# Patient Record
Sex: Male | Born: 1945 | Hispanic: Yes | Marital: Married
Health system: Southern US, Community
[De-identification: ages and names within clinical notes are randomized; demographics above are authoritative.]

## PROBLEM LIST (undated history)

## (undated) DIAGNOSIS — E119 Type 2 diabetes mellitus without complications: Secondary | ICD-10-CM

## (undated) DIAGNOSIS — N289 Disorder of kidney and ureter, unspecified: Secondary | ICD-10-CM

---

## 2017-08-30 ENCOUNTER — Other Ambulatory Visit (HOSPITAL_COMMUNITY): Payer: Self-pay | Admitting: Nephrology

## 2017-08-30 ENCOUNTER — Other Ambulatory Visit (HOSPITAL_COMMUNITY): Payer: Self-pay | Admitting: Nurse Practitioner

## 2017-08-30 DIAGNOSIS — Z992 Dependence on renal dialysis: Secondary | ICD-10-CM

## 2017-08-30 DIAGNOSIS — N186 End stage renal disease: Secondary | ICD-10-CM

## 2017-09-06 ENCOUNTER — Other Ambulatory Visit: Payer: Self-pay | Admitting: Radiology

## 2017-09-07 ENCOUNTER — Ambulatory Visit (HOSPITAL_COMMUNITY)
Admission: RE | Admit: 2017-09-07 | Discharge: 2017-09-07 | Disposition: A | Discharge status: Home / Self Care | Payer: Self-pay | Source: Ambulatory Visit | Attending: Nephrology | Admitting: Nephrology

## 2017-09-07 ENCOUNTER — Encounter (HOSPITAL_COMMUNITY): Payer: Self-pay | Admitting: Interventional Radiology

## 2017-09-07 ENCOUNTER — Other Ambulatory Visit (HOSPITAL_COMMUNITY): Payer: Self-pay | Admitting: Nephrology

## 2017-09-07 DIAGNOSIS — N186 End stage renal disease: Secondary | ICD-10-CM | POA: Insufficient documentation

## 2017-09-07 DIAGNOSIS — Z992 Dependence on renal dialysis: Secondary | ICD-10-CM | POA: Insufficient documentation

## 2017-09-07 DIAGNOSIS — Y832 Surgical operation with anastomosis, bypass or graft as the cause of abnormal reaction of the patient, or of later complication, without mention of misadventure at the time of the procedure: Secondary | ICD-10-CM | POA: Insufficient documentation

## 2017-09-07 DIAGNOSIS — T82858A Stenosis of vascular prosthetic devices, implants and grafts, initial encounter: Secondary | ICD-10-CM | POA: Insufficient documentation

## 2017-09-07 HISTORY — PX: IR AV DIALY SHUNT INTRO NEEDLE/INTRACATH INITIAL W/PTA/IMG LEFT: IMG6103

## 2017-09-07 HISTORY — PX: IR US GUIDE VASC ACCESS LEFT: IMG2389

## 2017-09-07 MED ORDER — LIDOCAINE HCL 1 % IJ SOLN
INTRAMUSCULAR | Status: AC
  Filled 2017-09-07: qty 20

## 2017-09-07 MED ORDER — IOPAMIDOL (ISOVUE-300) INJECTION 61%
INTRAVENOUS | Status: AC
  Filled 2017-09-07: qty 100

## 2017-09-07 MED ORDER — LIDOCAINE HCL (PF) 1 % IJ SOLN
INTRAMUSCULAR | Status: DC | PRN
  Administered 2017-09-07: 5 mL

## 2017-09-07 NOTE — Procedures (Signed)
Pre Procedure Dx: ESRD Post Procedure Dx: Same  Technically successful fistulogram with angioplasty.    EBL: Minimal  No immediate complications.  Jay Minola Guin, MD Pager #: [phone]  

## 2017-09-08 ENCOUNTER — Other Ambulatory Visit (HOSPITAL_COMMUNITY): Payer: Self-pay | Admitting: Nephrology

## 2017-09-08 ENCOUNTER — Encounter (HOSPITAL_COMMUNITY): Payer: Self-pay

## 2017-09-08 DIAGNOSIS — N186 End stage renal disease: Secondary | ICD-10-CM

## 2018-01-20 ENCOUNTER — Other Ambulatory Visit (HOSPITAL_COMMUNITY): Payer: Self-pay | Admitting: Nephrology

## 2018-01-20 DIAGNOSIS — N186 End stage renal disease: Secondary | ICD-10-CM

## 2018-01-20 DIAGNOSIS — Z992 Dependence on renal dialysis: Principal | ICD-10-CM

## 2018-01-25 ENCOUNTER — Other Ambulatory Visit: Payer: Self-pay | Admitting: Radiology

## 2018-01-27 ENCOUNTER — Encounter (HOSPITAL_COMMUNITY): Payer: Self-pay

## 2018-01-27 ENCOUNTER — Ambulatory Visit (HOSPITAL_COMMUNITY): Admission: RE | Admit: 2018-01-27 | Payer: Self-pay | Source: Ambulatory Visit

## 2018-04-01 ENCOUNTER — Emergency Department (HOSPITAL_COMMUNITY)
Admission: EM | Admit: 2018-04-01 | Discharge: 2018-04-01 | Disposition: A | Discharge status: Home / Self Care | Payer: Self-pay | Attending: Emergency Medicine | Admitting: Emergency Medicine

## 2018-04-01 ENCOUNTER — Encounter (HOSPITAL_COMMUNITY): Payer: Self-pay | Admitting: Emergency Medicine

## 2018-04-01 ENCOUNTER — Emergency Department (HOSPITAL_COMMUNITY): Payer: Self-pay

## 2018-04-01 ENCOUNTER — Other Ambulatory Visit: Payer: Self-pay

## 2018-04-01 DIAGNOSIS — Y939 Activity, unspecified: Secondary | ICD-10-CM | POA: Insufficient documentation

## 2018-04-01 DIAGNOSIS — Z992 Dependence on renal dialysis: Secondary | ICD-10-CM | POA: Insufficient documentation

## 2018-04-01 DIAGNOSIS — Y999 Unspecified external cause status: Secondary | ICD-10-CM | POA: Insufficient documentation

## 2018-04-01 DIAGNOSIS — N186 End stage renal disease: Secondary | ICD-10-CM | POA: Insufficient documentation

## 2018-04-01 DIAGNOSIS — E11628 Type 2 diabetes mellitus with other skin complications: Secondary | ICD-10-CM | POA: Insufficient documentation

## 2018-04-01 DIAGNOSIS — L03031 Cellulitis of right toe: Secondary | ICD-10-CM | POA: Insufficient documentation

## 2018-04-01 DIAGNOSIS — Y929 Unspecified place or not applicable: Secondary | ICD-10-CM | POA: Insufficient documentation

## 2018-04-01 DIAGNOSIS — X58XXXA Exposure to other specified factors, initial encounter: Secondary | ICD-10-CM | POA: Insufficient documentation

## 2018-04-01 DIAGNOSIS — L089 Local infection of the skin and subcutaneous tissue, unspecified: Secondary | ICD-10-CM | POA: Insufficient documentation

## 2018-04-01 HISTORY — DX: Disorder of kidney and ureter, unspecified: N28.9

## 2018-04-01 HISTORY — DX: Type 2 diabetes mellitus without complications: E11.9

## 2018-04-01 LAB — CBG MONITORING, ED: Glucose-Capillary: 156 mg/dL — ABNORMAL HIGH (ref 70–99)

## 2018-04-01 MED ORDER — CEPHALEXIN 500 MG PO CAPS: 500.0000 mg | ORAL_CAPSULE | Freq: Four times a day (QID) | ORAL | 0 refills | Status: AC

## 2018-04-01 MED ORDER — CEPHALEXIN 250 MG PO CAPS
500.0000 mg | ORAL_CAPSULE | Freq: Once | ORAL | Status: AC
  Administered 2018-04-01: 500 mg via ORAL
  Filled 2018-04-01: qty 2

## 2018-04-01 NOTE — ED Notes (Signed)
Spoke to son in detail regarding pt discharge and concerns for pt going home. No questions after conversation. Son to come and pick up pt soon.

## 2018-04-01 NOTE — Discharge Instructions (Signed)
Your examination today was consistent with diabetic wounds and cellulitis " skin infection" on the toes and foot.  The x-ray did not show evidence of bony infection.  Your sugar was not significant elevated today.  We feel you are safe for outpatient antibiotics and reassessment with your primary doctor in the next several days.  We will dress your wound, please keep it clean.  You will need to have the wound reassessed by your PCP in several days.  If any symptoms change or worsen or you begin having systemic signs of infection such as fevers, chills, nausea, vomiting, please return to the nearest emergency department.

## 2018-04-01 NOTE — ED Notes (Signed)
Patient transported to X-ray 

## 2018-04-01 NOTE — ED Notes (Addendum)
Son phone # [phone] Patient son will come back iif discharged. Nursing will ned to call son for pickup.

## 2018-04-01 NOTE — ED Provider Notes (Signed)
[facility] EMERGENCY DEPARTMENT Provider Note   CSN: [id number] Arrival date & time: 04/01/18  1733    History   Chief Complaint Chief Complaint  Patient presents with   Toe bleeding    HPI Ronald Lopez is a 73 y.o. male.     The history is provided by the patient and medical records. The history is limited by a language barrier. A language interpreter was used.  Rash  Location:  Toe Toe rash location:  R great toe, R second toe and R third toe Quality: blistering and redness   Quality: not painful   Severity:  Moderate Onset quality:  Gradual Duration:  3 days Timing:  Constant Progression:  Worsening Chronicity:  New Relieved by:  Nothing Worsened by:  Nothing Ineffective treatments:  None tried Associated symptoms: diarrhea   Associated symptoms: no abdominal pain, no fatigue, no fever, no headaches, no nausea, no shortness of breath and not vomiting     No past medical history on file.  There are no active problems to display for this patient.     Home Medications    Prior to Admission medications   Not on File    Family History No family history on file.  Social History Social History   Tobacco Use   Smoking status: Not on file  Substance Use Topics   Alcohol use: Not on file   Drug use: Not on file     Allergies   Patient has no allergy information on record.   Review of Systems Review of Systems  Constitutional: Negative for chills, diaphoresis, fatigue and fever.  HENT: Negative for congestion.   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  Respiratory: Negative for cough, chest tightness and shortness of breath.   Cardiovascular: Negative for chest pain, palpitations and leg swelling.  Gastrointestinal: Positive for diarrhea. Negative for abdominal pain, constipation, nausea and vomiting.  Genitourinary: Negative for flank pain.  Musculoskeletal: Negative for back pain, neck  pain and neck stiffness.  Skin: Positive for rash and wound.  Neurological: Negative for headaches.  Psychiatric/Behavioral: Negative for agitation.  All other systems reviewed and are negative.    Physical Exam Updated Vital Signs BP (!) 161/84    Pulse 73    Temp 98.2 F (36.8 C) (Oral)    Resp 17    SpO2 100%   Physical Exam Vitals signs and nursing note reviewed.  Constitutional:      General: He is not in acute distress.    Appearance: He is not ill-appearing or toxic-appearing.  HENT:     Head: Normocephalic.     Nose: No congestion or rhinorrhea.  Eyes:     Extraocular Movements: Extraocular movements intact.     Conjunctiva/sclera: Conjunctivae normal.     Pupils: Pupils are equal, round, and reactive to light.  Neck:     Musculoskeletal: No muscular tenderness.  Cardiovascular:     Rate and Rhythm: Normal rate.     Pulses: Normal pulses.     Heart sounds: No murmur.  Pulmonary:     Effort: Pulmonary effort is normal.     Breath sounds: No stridor. No wheezing, rhonchi or rales.  Chest:     Chest wall: No tenderness.  Abdominal:     General: Abdomen is flat. There is no distension.  Musculoskeletal:        General: No tenderness.     Right lower leg: No edema.     Left lower leg:  No edema.       Feet:  Skin:    Capillary Refill: Capillary refill takes less than 2 seconds.     Findings: Erythema present.  Neurological:     Mental Status: He is alert.     Sensory: Sensory deficit (bilateral foot numbness) present.     Motor: No weakness.     Comments: Numbness in bilater feet similar for last 10 years per pt report.   Psychiatric:        Mood and Affect: Mood normal.           ED Treatments / Results  Labs (all labs ordered are listed, but only abnormal results are displayed) Labs Reviewed  CBG MONITORING, ED - Abnormal; Notable for the following components:      Result Value   Glucose-Capillary 156 (*)    All other components within normal  limits    EKG None  Radiology Dg Foot Complete Right  Result Date: 04/01/2018 CLINICAL DATA:  Bleeding toes. EXAM: RIGHT FOOT COMPLETE - 3+ VIEW COMPARISON:  None. FINDINGS: Severe bony demineralization. Extensive vascular calcifications. Deformity of the proximal phalanx of the second toe probably from an old fracture which has healed, correlate with any findings of the second toe on physical exam. No appreciable bony destructive findings to suggest active osteomyelitis. IMPRESSION: 1. No bony destructive findings characteristic of osteomyelitis. No gas tracking in the soft tissues. 2. Deformity of the proximal phalanx second toe, probably due to an old fracture, correlate with any physical exam findings of the second toe. 3. Markedly severe bony demineralization. 4. Markedly severe atherosclerotic vascular calcifications in the foot. Electronically Signed   By: Van Clines M.D.   On: 04/01/2018 19:15    Procedures Procedures (including critical care time)  Medications Ordered in ED Medications  cephALEXin (KEFLEX) capsule 500 mg (500 mg Oral Given 04/01/18 2002)     Initial Impression / Assessment and Plan / ED Course  I have reviewed the triage vital signs and the nursing notes.  Pertinent labs & imaging results that were available during my care of the patient were reviewed by me and considered in my medical decision making (see chart for details).        Ronald Lopez is a 73 y.o. male with a past medical history significant for diabetes with diabetic neuropathies for 10 years, chronic pains, and ESRD who presents for right foot bleeding.  Patient is brought in by EMS he reports that patient's wife changes his socks when he needs to go out.  She change his socks today and saw some bleeding on the toes.  At the patient dialysis center, patient had more bleeding from his right great, second, and third toes prompting him to come in for evaluation.  Patient reports that he  cannot feel his toes and feet and has not felt his feet for 10 years due to neuropathy.  He reports that he does not like wearing masks.  He denies fevers or chills.  He reports chronic headaches, neck pains, diffuse body aches.  He denies nausea, vomiting, or constipation.  He denies any pain in his foot but he cannot feel the foot.  On exam, patient has blisters and what appear to be pressure wounds on his great, second, and third toe on the right.  There is some darkened area.  There is some mild erythema just proximal to the wounds. Patient has decreased sensation in all foot.  Patient does have a palpable DP pulse.  Patient can move his foot and right leg similar to left.  Patient had otherwise clear lungs, nontender chest, nontender abdomen.  Spanish interpreter was used for all conversations.  Clinical photos were taken, see above for images of the feet.  Clinically I suspect patient has pressure wounds on his right foot due to infrequent sock changes and his neuropathies.  He was primarily sent here due to bleeding which has resolved.  He is not currently bleeding whatsoever.  I am however also concerned about mild cellulitis due to the erythema more proximal.  Patient has no sensation and this cannot tell about tenderness however patient with x-ray to rule out osteomyelitis or subcutaneous gas.  Low suspicion for this.  Patient CBG was in the 150s, do not feel he is in DKA or other significant glucose abnormality.  Given his lack of systemic symptoms of fevers chills nausea or vomiting, low suspicion for sepsis at this time.  Vital signs reassuring on arrival.  Patient will have x-ray to look for osteomyelitis and if imaging is reassuring and patient has continued resolution of his bleeding, we will dressed the wounds with dressings and start patient on antibiotics and will likely be stable for discharge.  7:38 PM X-ray showed no bony destructive findings concerning for osteomyelitis.  No gas in  the subcutaneous tissues.  Old fracture was seen and no other significant abnormalities.  Do not feel patient has osteomyelitis and suspect he has a mild cellulitis in the setting of these diabetic pressure wounds on his toes.  Wounds will be cleaned and dressed.  Patient will be given dose of Keflex for cellulitis.  Patient will follow-up with his PCP in the neck several days and be started on Keflex for cellulitis.  Patient understood and agreed with this plan of care as well as return precautions.  He had no other questions or concerns and will be discharged.    A Spanish interpreter was used for all interactions with the patient.  Final Clinical Impressions(s) / ED Diagnoses   Final diagnoses:  Cellulitis of toe of right foot  Diabetic foot infection [facility])    ED Discharge Orders         Ordered    cephALEXin (KEFLEX) 500 MG capsule  4 times daily     04/01/18 1958         Clinical Impression: 1. Cellulitis of toe of right foot   2. Diabetic foot infection ([place])     Disposition: Discharge  Condition: Good  I have discussed the results, Dx and Tx plan with the pt(& family if present). He/she/they expressed understanding and agree(s) with the plan. Discharge instructions discussed at great length. Strict return precautions discussed and pt &/or family have verbalized understanding of the instructions. No further questions at time of discharge.    New Prescriptions   CEPHALEXIN (KEFLEX) 500 MG CAPSULE    Take 1 capsule (500 mg total) by mouth 4 (four) times daily for 14 days.    Follow Up: Jamul Eldridge [id number] [phone] Schedule an appointment as soon as possible for a visit    [place] [address] [id number] mc [place]       Wally Behan, Gwenyth Allegra, MD 04/01/18 2056

## 2018-04-01 NOTE — ED Triage Notes (Signed)
Pt picked up from home due to r toe bleeding noticed by wife this morning. Pt has hx of dialysis and DM. Wife unsure of how long toe has been bleeding.

## 2018-04-01 NOTE — ED Notes (Signed)
Patient verbalizes understanding of discharge instructions. Opportunity for questioning and answers were provided. Armband removed by staff, pt discharged from ED.  

## 2018-04-01 NOTE — ED Notes (Signed)
Pt transported to xray 

## 2018-04-25 ENCOUNTER — Other Ambulatory Visit: Payer: Self-pay

## 2018-04-25 ENCOUNTER — Emergency Department (HOSPITAL_COMMUNITY): Payer: Self-pay

## 2018-04-25 ENCOUNTER — Inpatient Hospital Stay (HOSPITAL_COMMUNITY)
Admission: EM | Admit: 2018-04-25 | Discharge: 2018-04-27 | DRG: 299 | Disposition: A | Discharge status: Hospice | Payer: Self-pay | Attending: Internal Medicine | Admitting: Internal Medicine

## 2018-04-25 ENCOUNTER — Encounter (HOSPITAL_COMMUNITY): Payer: Self-pay | Admitting: Emergency Medicine

## 2018-04-25 DIAGNOSIS — I70203 Unspecified atherosclerosis of native arteries of extremities, bilateral legs: Secondary | ICD-10-CM | POA: Diagnosis present

## 2018-04-25 DIAGNOSIS — E1140 Type 2 diabetes mellitus with diabetic neuropathy, unspecified: Secondary | ICD-10-CM

## 2018-04-25 DIAGNOSIS — Z79899 Other long term (current) drug therapy: Secondary | ICD-10-CM

## 2018-04-25 DIAGNOSIS — E119 Type 2 diabetes mellitus without complications: Secondary | ICD-10-CM

## 2018-04-25 DIAGNOSIS — L03031 Cellulitis of right toe: Secondary | ICD-10-CM | POA: Diagnosis present

## 2018-04-25 DIAGNOSIS — Z888 Allergy status to other drugs, medicaments and biological substances status: Secondary | ICD-10-CM

## 2018-04-25 DIAGNOSIS — E1122 Type 2 diabetes mellitus with diabetic chronic kidney disease: Secondary | ICD-10-CM | POA: Diagnosis present

## 2018-04-25 DIAGNOSIS — Z515 Encounter for palliative care: Secondary | ICD-10-CM | POA: Diagnosis present

## 2018-04-25 DIAGNOSIS — E1152 Type 2 diabetes mellitus with diabetic peripheral angiopathy with gangrene: Principal | ICD-10-CM | POA: Diagnosis present

## 2018-04-25 DIAGNOSIS — G9349 Other encephalopathy: Secondary | ICD-10-CM | POA: Diagnosis present

## 2018-04-25 DIAGNOSIS — R778 Other specified abnormalities of plasma proteins: Secondary | ICD-10-CM | POA: Diagnosis present

## 2018-04-25 DIAGNOSIS — I998 Other disorder of circulatory system: Secondary | ICD-10-CM

## 2018-04-25 DIAGNOSIS — I700 Atherosclerosis of aorta: Secondary | ICD-10-CM | POA: Diagnosis present

## 2018-04-25 DIAGNOSIS — R69 Illness, unspecified: Secondary | ICD-10-CM

## 2018-04-25 DIAGNOSIS — N186 End stage renal disease: Secondary | ICD-10-CM | POA: Diagnosis present

## 2018-04-25 DIAGNOSIS — G9341 Metabolic encephalopathy: Secondary | ICD-10-CM | POA: Diagnosis present

## 2018-04-25 DIAGNOSIS — N250 Renal osteodystrophy: Secondary | ICD-10-CM | POA: Diagnosis present

## 2018-04-25 DIAGNOSIS — E875 Hyperkalemia: Secondary | ICD-10-CM | POA: Diagnosis present

## 2018-04-25 DIAGNOSIS — M549 Dorsalgia, unspecified: Secondary | ICD-10-CM | POA: Diagnosis present

## 2018-04-25 DIAGNOSIS — J90 Pleural effusion, not elsewhere classified: Secondary | ICD-10-CM | POA: Diagnosis present

## 2018-04-25 DIAGNOSIS — Z9115 Patient's noncompliance with renal dialysis: Secondary | ICD-10-CM

## 2018-04-25 DIAGNOSIS — R109 Unspecified abdominal pain: Secondary | ICD-10-CM | POA: Diagnosis present

## 2018-04-25 DIAGNOSIS — R4182 Altered mental status, unspecified: Secondary | ICD-10-CM | POA: Diagnosis present

## 2018-04-25 DIAGNOSIS — D638 Anemia in other chronic diseases classified elsewhere: Secondary | ICD-10-CM | POA: Diagnosis present

## 2018-04-25 DIAGNOSIS — R64 Cachexia: Secondary | ICD-10-CM | POA: Diagnosis present

## 2018-04-25 DIAGNOSIS — E11649 Type 2 diabetes mellitus with hypoglycemia without coma: Secondary | ICD-10-CM | POA: Diagnosis present

## 2018-04-25 DIAGNOSIS — Z9889 Other specified postprocedural states: Secondary | ICD-10-CM

## 2018-04-25 DIAGNOSIS — I96 Gangrene, not elsewhere classified: Secondary | ICD-10-CM

## 2018-04-25 DIAGNOSIS — R7989 Other specified abnormal findings of blood chemistry: Secondary | ICD-10-CM | POA: Diagnosis present

## 2018-04-25 DIAGNOSIS — E43 Unspecified severe protein-calorie malnutrition: Secondary | ICD-10-CM | POA: Diagnosis present

## 2018-04-25 DIAGNOSIS — I7090 Unspecified atherosclerosis: Secondary | ICD-10-CM | POA: Diagnosis present

## 2018-04-25 DIAGNOSIS — Z992 Dependence on renal dialysis: Secondary | ICD-10-CM

## 2018-04-25 DIAGNOSIS — R1013 Epigastric pain: Secondary | ICD-10-CM

## 2018-04-25 DIAGNOSIS — I708 Atherosclerosis of other arteries: Secondary | ICD-10-CM | POA: Diagnosis present

## 2018-04-25 DIAGNOSIS — Z66 Do not resuscitate: Secondary | ICD-10-CM | POA: Diagnosis present

## 2018-04-25 LAB — CBC
HCT: 35.3 % — ABNORMAL LOW (ref 39.0–52.0)
Hemoglobin: 10.9 g/dL — ABNORMAL LOW (ref 13.0–17.0)
MCH: 27.7 pg (ref 26.0–34.0)
MCHC: 30.9 g/dL (ref 30.0–36.0)
MCV: 89.6 fL (ref 80.0–100.0)
Platelets: 117 10*3/uL — ABNORMAL LOW (ref 150–400)
RBC: 3.94 MIL/uL — ABNORMAL LOW (ref 4.22–5.81)
RDW: 19.9 % — ABNORMAL HIGH (ref 11.5–15.5)
WBC: 5.7 10*3/uL (ref 4.0–10.5)
nRBC: 0 % (ref 0.0–0.2)

## 2018-04-25 LAB — PROTIME-INR
INR: 1.4 — ABNORMAL HIGH (ref 0.8–1.2)
Prothrombin Time: 16.6 seconds — ABNORMAL HIGH (ref 11.4–15.2)

## 2018-04-25 LAB — POCT I-STAT EG7
Bicarbonate: 24.8 mmol/L (ref 20.0–28.0)
Calcium, Ion: 0.99 mmol/L — ABNORMAL LOW (ref 1.15–1.40)
HCT: 35 % — ABNORMAL LOW (ref 39.0–52.0)
Hemoglobin: 11.9 g/dL — ABNORMAL LOW (ref 13.0–17.0)
O2 Saturation: 43 %
Potassium: 5.4 mmol/L — ABNORMAL HIGH (ref 3.5–5.1)
Sodium: 139 mmol/L (ref 135–145)
TCO2: 26 mmol/L (ref 22–32)
pCO2, Ven: 42.2 mmHg — ABNORMAL LOW (ref 44.0–60.0)
pH, Ven: 7.378 (ref 7.250–7.430)
pO2, Ven: 25 mmHg — CL (ref 32.0–45.0)

## 2018-04-25 LAB — CBG MONITORING, ED: Glucose-Capillary: 61 mg/dL — ABNORMAL LOW (ref 70–99)

## 2018-04-25 LAB — COMPREHENSIVE METABOLIC PANEL
ALT: 8 U/L (ref 0–44)
AST: 16 U/L (ref 15–41)
Albumin: 3.2 g/dL — ABNORMAL LOW (ref 3.5–5.0)
Alkaline Phosphatase: 77 U/L (ref 38–126)
Anion gap: 24 — ABNORMAL HIGH (ref 5–15)
BUN: 92 mg/dL — ABNORMAL HIGH (ref 8–23)
CO2: 20 mmol/L — ABNORMAL LOW (ref 22–32)
Calcium: 8.9 mg/dL (ref 8.9–10.3)
Chloride: 96 mmol/L — ABNORMAL LOW (ref 98–111)
Creatinine, Ser: 14.28 mg/dL — ABNORMAL HIGH (ref 0.61–1.24)
GFR calc Af Amer: 3 mL/min — ABNORMAL LOW (ref >60)
GFR calc non Af Amer: 3 mL/min — ABNORMAL LOW (ref >60)
Glucose, Bld: 71 mg/dL (ref 70–99)
Potassium: 5.3 mmol/L — ABNORMAL HIGH (ref 3.5–5.1)
Sodium: 140 mmol/L (ref 135–145)
Total Bilirubin: 0.9 mg/dL (ref 0.3–1.2)
Total Protein: 8.9 g/dL — ABNORMAL HIGH (ref 6.5–8.1)

## 2018-04-25 LAB — TROPONIN I
Troponin I: 0.1 ng/mL (ref <0.03)
Troponin I: 0.07 ng/mL (ref <0.03)
Troponin I: 0.07 ng/mL
Troponin I: 0.06 ng/mL (ref <0.03)

## 2018-04-25 LAB — TYPE AND SCREEN
ABO/RH(D): O POS
Antibody Screen: NEGATIVE

## 2018-04-25 LAB — ABO/RH: ABO/RH(D): O POS

## 2018-04-25 LAB — GLUCOSE, CAPILLARY: Glucose-Capillary: 86 mg/dL (ref 70–99)

## 2018-04-25 LAB — LACTIC ACID, PLASMA: Lactic Acid, Venous: 1.6 mmol/L (ref 0.5–1.9)

## 2018-04-25 LAB — LACTATE DEHYDROGENASE: LDH: 130 U/L (ref 98–192)

## 2018-04-25 LAB — LIPASE, BLOOD: Lipase: 25 U/L (ref 11–51)

## 2018-04-25 MED ORDER — TRAMADOL HCL 50 MG PO TABS: 25.0000 mg | ORAL_TABLET | Freq: Four times a day (QID) | ORAL | Status: DC | PRN

## 2018-04-25 MED ORDER — SODIUM CHLORIDE 0.9 % IV SOLN: 100.0000 mL | INTRAVENOUS | Status: DC | PRN

## 2018-04-25 MED ORDER — ACETAMINOPHEN 325 MG PO TABS: 650.0000 mg | ORAL_TABLET | Freq: Four times a day (QID) | ORAL | Status: DC | PRN

## 2018-04-25 MED ORDER — IOHEXOL 350 MG/ML SOLN
100.0000 mL | Freq: Once | INTRAVENOUS | Status: AC | PRN
  Administered 2018-04-25: 100 mL via INTRAVENOUS

## 2018-04-25 MED ORDER — ACETAMINOPHEN 650 MG RE SUPP: 650.0000 mg | Freq: Four times a day (QID) | RECTAL | Status: DC | PRN

## 2018-04-25 MED ORDER — SEVELAMER CARBONATE 800 MG PO TABS: 1600.0000 mg | ORAL_TABLET | Freq: Three times a day (TID) | ORAL | Status: DC

## 2018-04-25 MED ORDER — LORAZEPAM 2 MG/ML IJ SOLN
0.5000 mg | Freq: Once | INTRAMUSCULAR | Status: AC
  Administered 2018-04-26: 0.5 mg via INTRAVENOUS
  Filled 2018-04-25: qty 1

## 2018-04-25 MED ORDER — FENTANYL CITRATE (PF) 100 MCG/2ML IJ SOLN
25.0000 ug | Freq: Once | INTRAMUSCULAR | Status: AC
  Administered 2018-04-25: 25 ug via INTRAVENOUS
  Filled 2018-04-25: qty 2

## 2018-04-25 MED ORDER — VANCOMYCIN HCL IN DEXTROSE 500-5 MG/100ML-% IV SOLN
500.0000 mg | INTRAVENOUS | Status: DC
  Filled 2018-04-25: qty 100

## 2018-04-25 MED ORDER — MORPHINE SULFATE (PF) 2 MG/ML IV SOLN: 1.0000 mg | INTRAVENOUS | Status: DC | PRN

## 2018-04-25 MED ORDER — DIPHENHYDRAMINE HCL 50 MG/ML IJ SOLN
25.0000 mg | Freq: Once | INTRAMUSCULAR | Status: AC
  Administered 2018-04-25: 25 mg via INTRAVENOUS
  Filled 2018-04-25: qty 1

## 2018-04-25 MED ORDER — MORPHINE SULFATE (PF) 2 MG/ML IV SOLN
2.0000 mg | Freq: Once | INTRAVENOUS | Status: AC
  Administered 2018-04-25: 2 mg via INTRAVENOUS
  Filled 2018-04-25: qty 1

## 2018-04-25 MED ORDER — RISAQUAD PO CAPS
1.0000 | ORAL_CAPSULE | Freq: Every day | ORAL | Status: DC
  Administered 2018-04-27: 10:00:00 1 via ORAL
  Filled 2018-04-25 (×2): qty 1

## 2018-04-25 MED ORDER — ONDANSETRON HCL 4 MG/2ML IJ SOLN: 4.0000 mg | Freq: Four times a day (QID) | INTRAMUSCULAR | Status: DC | PRN

## 2018-04-25 MED ORDER — CHLORHEXIDINE GLUCONATE CLOTH 2 % EX PADS: 6.0000 | MEDICATED_PAD | Freq: Every day | CUTANEOUS | Status: DC

## 2018-04-25 MED ORDER — HALOPERIDOL LACTATE 5 MG/ML IJ SOLN
2.0000 mg | Freq: Once | INTRAMUSCULAR | Status: DC
  Filled 2018-04-25: qty 1

## 2018-04-25 MED ORDER — PIPERACILLIN-TAZOBACTAM 3.375 G IVPB 30 MIN
3.3750 g | Freq: Once | INTRAVENOUS | Status: AC
  Administered 2018-04-25: 23:00:00 3.375 g via INTRAVENOUS

## 2018-04-25 MED ORDER — PIPERACILLIN-TAZOBACTAM 3.375 G IVPB
3.3750 g | Freq: Two times a day (BID) | INTRAVENOUS | Status: DC
  Administered 2018-04-26 – 2018-04-27 (×2): 3.375 g via INTRAVENOUS
  Filled 2018-04-25 (×3): qty 50

## 2018-04-25 MED ORDER — SODIUM CHLORIDE 0.9 % IV SOLN
1.0000 g | Freq: Once | INTRAVENOUS | Status: AC
  Administered 2018-04-25: 18:00:00 1 g via INTRAVENOUS
  Filled 2018-04-25: qty 10

## 2018-04-25 MED ORDER — INSULIN ASPART 100 UNIT/ML ~~LOC~~ SOLN: 0.0000 [IU] | Freq: Three times a day (TID) | SUBCUTANEOUS | Status: DC

## 2018-04-25 MED ORDER — ONDANSETRON HCL 4 MG PO TABS: 4.0000 mg | ORAL_TABLET | Freq: Four times a day (QID) | ORAL | Status: DC | PRN

## 2018-04-25 MED ORDER — VANCOMYCIN HCL IN DEXTROSE 1-5 GM/200ML-% IV SOLN
1000.0000 mg | Freq: Once | INTRAVENOUS | Status: AC
  Administered 2018-04-25: 1000 mg via INTRAVENOUS
  Filled 2018-04-25: qty 200

## 2018-04-25 MED ORDER — DEXTROSE 50 % IV SOLN
1.0000 | Freq: Once | INTRAVENOUS | Status: AC
  Administered 2018-04-25: 17:00:00 50 mL via INTRAVENOUS
  Filled 2018-04-25: qty 50

## 2018-04-25 MED ORDER — HEPARIN SODIUM (PORCINE) 5000 UNIT/ML IJ SOLN
5000.0000 [IU] | Freq: Three times a day (TID) | INTRAMUSCULAR | Status: DC
  Administered 2018-04-25 – 2018-04-27 (×4): 5000 [IU] via SUBCUTANEOUS
  Filled 2018-04-25 (×5): qty 1

## 2018-04-25 MED ORDER — SODIUM ZIRCONIUM CYCLOSILICATE 5 G PO PACK
5.0000 g | PACK | Freq: Once | ORAL | Status: DC
  Filled 2018-04-25 (×2): qty 1

## 2018-04-25 MED ORDER — FENTANYL CITRATE (PF) 100 MCG/2ML IJ SOLN
50.0000 ug | Freq: Once | INTRAMUSCULAR | Status: AC
  Administered 2018-04-25: 50 ug via INTRAVENOUS
  Filled 2018-04-25: qty 2

## 2018-04-25 MED ORDER — ATORVASTATIN CALCIUM 10 MG PO TABS
20.0000 mg | ORAL_TABLET | Freq: Every day | ORAL | Status: DC
  Administered 2018-04-27: 10:00:00 20 mg via ORAL
  Filled 2018-04-25 (×2): qty 2

## 2018-04-25 NOTE — Progress Notes (Signed)
Pharmacy Antibiotic Note  Suhail Peloquin is a 73 y.o. male admitted on 04/25/2018 with cellulitis.  Pharmacy has been consulted for vancomycin and zosyn dosing. Pt is afebrile and WBC is WNL. Pt with history of ESRD on HD.   Plan: Vancomycin 1gm IV x 1 then 500mg  post-HD Zosyn 3.375gm IV Q12H (4 hr inf) F/u renal fxn, C&S, clinical status and pre-HD vanc level PRN   Temp (24hrs), Avg:95.8 F (35.4 C), Min:95.8 F (35.4 C), Max:95.8 F (35.4 C)  Recent Labs  Lab 04/25/18 1532  WBC 5.7  CREATININE 14.28*  LATICACIDVEN 1.6    CrCl cannot be calculated (Unknown ideal weight.).    No Known Allergies  Antimicrobials this admission: Vanc 4/21>> Zosyn 4/21>> CTX x 1 4/21  Dose adjustments this admission: N/A  Microbiology results: Pending  Thank you for allowing pharmacy to be a part of this patient's care.  Zafirah Vanzee, Rande Lawman 04/25/2018 7:00 PM

## 2018-04-25 NOTE — H&P (Signed)
History and Physical    Ronald Lopez GYK:[id number] DOB: 05/20/1945 DOA: 04/25/2018  Referring MD/NP/PA:   PCP: Patient, No Pcp Per   Patient coming from:  The patient is coming from home.  Recently declining significantly  Chief Complaint: abdominal pain and confusion  HPI: Ronald Lopez is a 73 y.o. male with medical history significant of HTN, HLD, DM, ESRD on HD, presented to ED for abdominal pain. History is mostly obtained from ED physician and patient's son due to patient's altered mental status and language barrier. Per son, patient has been physically declining in the past 2 to 3 weeks, refusing eating and dialysis. Not take medications. He has been c/o generalized pain, hurting everywhere. No fever, chills, nausea, vomit or diarrhea. Today he finally went to dialysis but eventually sent to ED due to sudden severe abdominal pain radiating to back.   ED Course: pt was found to be confused. Denies chest pain, c/o pain but not able to specifically localize pain. Right foot toe necrotic changes noted. Afebrile, no tachycardia, BP 124/92-157/97, O2 sat 92-100% on room air. Labs significant for K 5.3 BUN/Cr 92/14.28, bicarb 20, anion gap 24, VBG pH 7.378, hemoglobin 10.9, troponin 0.1, EKG with diffuse T wave inversion with repeated EKG. CXR showed large, loculated left pleural effusion with underlying atelectasis/consolidation, Chest CTA and Ct Angio Aortobifemoral no PE or dissection, showed extensive atherosclerosis without significant obstruction, and large left pleural effusion. Pt is given vanc and ceftriaxone and admit for further management.   Review of Systems:   General: no fevers, chills, significant weight loss, has poor appetite, weakness HEENT: no blurry vision, hearing changes or sore throat Respiratory: no dyspnea, coughing, wheezing CV: no chest pain, no palpitations GI: abdominal pain, no nausea, vomiting, diarrhea, constipation Ext: no leg edema Neuro: no  unilateral weakness, numbness, or tingling, no vision change or hearing loss Skin: right foot toe necrotic wound. Heme: No easy bruising.  Travel history: No recent long distant travel.  Allergy: No Known Allergies  Past Medical History:  Diagnosis Date   Diabetes mellitus without complication ([place])    Renal disorder     Past Surgical History:  Procedure Laterality Date   IR AV DIALY SHUNT INTRO NEEDLE/INTRACATH INITIAL W/PTA/IMG LEFT  09/07/2017   IR US GUIDE VASC ACCESS LEFT  09/07/2017    Social History:  has no history on file for tobacco, alcohol, and drug.  Family History: History reviewed. No pertinent family history.   Prior to Admission medications   Medication Sig Start Date End Date Taking? Authorizing Provider  ferric citrate (AURYXIA) 1 GM 210 MG(Fe) tablet Take 420 mg by mouth 3 (three) times daily with meals.   Yes [provider]  atorvastatin (LIPITOR) 20 MG tablet Take 20 mg by mouth daily.    [provider]  carvedilol (COREG) 3.125 MG tablet Take 3.125 mg by mouth 2 (two) times daily with a meal.    [provider]  losartan (COZAAR) 50 MG tablet Take 50 mg by mouth daily.    [provider]  sevelamer carbonate (RENVELA) 800 MG tablet Take 1,600 mg by mouth 3 (three) times daily with meals.    [provider]    Physical Exam: Vitals:   04/25/18 1645 04/25/18 1715 04/25/18 1730 04/25/18 1745  BP: (!) 143/74 (!) 150/78 (!) 151/52 (!) 124/92  Pulse: 66 64 68 66  Resp: 15 11 (!) 7 10  Temp:      TempSrc:  SpO2: 92% 100% 100% 97%   General: Not in acute distress HEENT:       Eyes: right eye blind.       ENT: No discharge from the ears and nose       Neck: No JVD Cardiac: S1/S2, RRR, No murmurs, No gallops or rubs. Respiratory: diminished breath sound at left side. No rales, wheezing, rhonchi or rubs. GI: Soft, scaphoid, tenderness at epigastric region, no rebound pain, no organomegaly, BS  present. Ext: No pitting leg edema bilaterally.  Pedal pulses palpable, front part of right foot is cold. Skin: 2nd and 3rd toe on right foot necrotic, great toe and 4th toe partial necrotic and broken blister.  Scratch marks noted on the back. Neuro: Alert, not able to assess orientation, cranial nerves II-XII grossly intact, moves all extremities. Psych: not able to assess  Labs on Admission: I have personally reviewed following labs and imaging studies  CBC: Recent Labs  Lab 04/25/18 1532 04/25/18 1607  WBC 5.7  --   HGB 10.9* 11.9*  HCT 35.3* 35.0*  MCV 89.6  --   PLT 117*  --    Basic Metabolic Panel: Recent Labs  Lab 04/25/18 1532 04/25/18 1607  NA 140 139  K 5.3* 5.4*  CL 96*  --   CO2 20*  --   GLUCOSE 71  --   BUN 92*  --   CREATININE 14.28*  --   CALCIUM 8.9  --    GFR: CrCl cannot be calculated (Unknown ideal weight.). Liver Function Tests: Recent Labs  Lab 04/25/18 1532  AST 16  ALT 8  ALKPHOS 77  BILITOT 0.9  PROT 8.9*  ALBUMIN 3.2*   Recent Labs  Lab 04/25/18 1532  LIPASE 25   No results for input(s): AMMONIA in the last 168 hours. Coagulation Profile: Recent Labs  Lab 04/25/18 1532  INR 1.4*   Cardiac Enzymes: Recent Labs  Lab 04/25/18 1532  TROPONINI 0.10*   BNP (last 3 results) No results for input(s): PROBNP in the last 8760 hours. HbA1C: No results for input(s): HGBA1C in the last 72 hours. CBG: Recent Labs  Lab 04/25/18 1625  GLUCAP 61*   Lipid Profile: No results for input(s): CHOL, HDL, LDLCALC, TRIG, CHOLHDL, LDLDIRECT in the last 72 hours. Thyroid Function Tests: No results for input(s): TSH, T4TOTAL, FREET4, T3FREE, THYROIDAB in the last 72 hours. Anemia Panel: No results for input(s): VITAMINB12, FOLATE, FERRITIN, TIBC, IRON, RETICCTPCT in the last 72 hours. Urine analysis: No results found for: COLORURINE, APPEARANCEUR, LABSPEC, PHURINE, GLUCOSEU, HGBUR, BILIRUBINUR, KETONESUR, PROTEINUR, UROBILINOGEN,  NITRITE, LEUKOCYTESUR Sepsis Labs: @LABRCNTIP (procalcitonin:4,lacticidven:4) )No results found for this or any previous visit (from the past 240 hour(s)).   Radiological Exams on Admission: Ct Head Wo Contrast  Result Date: 04/25/2018 CLINICAL DATA:  Altered level of consciousness EXAM: CT HEAD WITHOUT CONTRAST TECHNIQUE: Contiguous axial images were obtained from the base of the skull through the vertex without intravenous contrast. COMPARISON:  None. FINDINGS: Brain: No evidence of acute infarction, hemorrhage, extra-axial collection, ventriculomegaly, or mass effect. Generalized cerebral atrophy. Periventricular white matter low attenuation likely secondary to microangiopathy. Vascular: Cerebrovascular atherosclerotic calcifications are noted. Skull: Negative for fracture or focal lesion. Sinuses/Orbits: Visualized portions of the orbits are unremarkable. Mastoid sinuses are clear. Right maxillary sinus mucosal thickening. Other: None. IMPRESSION: No acute intracranial pathology. Electronically Signed   By: Kathreen Devoid   On: 04/25/2018 16:59   Ct Angio Aortobifemoral W And/or Wo Contrast  Result Date: 04/25/2018 CLINICAL DATA:  73 year old male with a history of right foot wound and concern for chronic limb threatening ischemia EXAM: CT ANGIOGRAPHY OF CHEST ABDOMINAL AORTA WITH ILIOFEMORAL RUNOFF TECHNIQUE: Multidetector CT imaging of the chest, abdomen, pelvis and lower extremities was performed using the standard protocol during bolus administration of intravenous contrast, following multi detector spiral CT imaging of the chest without contrast. Multiplanar CT image reconstructions and MIPs were obtained to evaluate the vascular anatomy. CONTRAST:  15mL OMNIPAQUE IOHEXOL 350 MG/ML SOLN COMPARISON:  None. FINDINGS: Chest: Cardiovascular: Heart: Cardiomegaly. No pericardial fluid/thickening. Differential attenuation of the blood pool and the myocardium on the precontrast images. Dense  calcifications of left main, left anterior descending, circumflex, right coronary arteries. Aorta: No aneurysm or dissection. Atherosclerotic changes of the thoracic aorta. Pulmonary arteries: No central, lobar, segmental, or proximal subsegmental filling defects. Mediastinum/Nodes: No significant mediastinal adenopathy, with the sensitivity decreased given the patient's body habitus and timing of the contrast bolus. Unremarkable appearance of the thoracic esophagus. Unremarkable thoracic inlet. Lungs/Pleura: Central airways are clear. Right lung clear with no confluent airspace disease, pneumothorax or pleural effusion. Right-sided pleural effusion with near complete atelectasis of the right lower lobe. Apicoposterior segment of the left upper lobe is aerated. Near complete collapse of the lingula. No pneumothorax. Musculoskeletal: No acute displaced fracture. Degenerative changes of the spine. Review of the MIP images confirms the above findings. Abdomen/pelvis: VASCULAR Aorta: Atherosclerotic changes of the abdominal aorta. No aneurysm or dissection. No definite evidence of periaortic fluid. Celiac: Atherosclerotic changes at the celiac artery origin. Branches are patent. There is preferential calcification of distal branches/small vessels of the splenic artery. SMA: SMA patent with mild atherosclerotic changes at the origin. Preferential calcification of the distal branches of the SMA distribution. Renals: Mixed calcified and soft plaque at the origin of the right renal artery with at least 50% stenosis. Dense calcifications of the distal right renal arterial distribution. On the left there is atherosclerotic changes at the origin of the renal artery. Symmetric to the right there is dense calcifications of the small vessels of the left renal artery distribution. IMA: IMA remains patent with dense calcifications at the origin. Right lower extremity: Advanced atherosclerosis of the right iliac system with no  high-grade stenosis or occlusion. No aneurysm or dissection. Hypogastric artery is patent with advanced atherosclerosis of the pelvic arteries. Common femoral artery patent with mild atherosclerotic changes. Profunda femoris patent with patent thigh branches. Dense calcifications. There is circumferential calcifications of nearly the entire native SFA from the origin through the adductor canal and the popliteal artery. Degree of calcification limits the sensitivity for luminal evaluation, however, the SFA appears patent. Beyond the adductor canal, the study is nondiagnostic. There is nearly complete circumferential calcification of all 3 tibial arteries to the ankle yielding the CT a nondiagnostic. Left lower extremity: Advanced atherosclerosis of the left iliac system with no high-grade stenosis or occlusion identified. No aneurysm or dissection. Hypogastric artery patent with advanced atherosclerosis of the pelvic arteries. Common femoral artery with mild atherosclerotic changes. Profunda femoris is patent as well as the thigh branches. Advanced circumferential atherosclerotic calcifications of the thigh branches. There is circumferential calcification of nearly the entire length of the left SFA. The degree of calcification limits the sensitivity for the evaluation of luminal narrowing, however, the SFA appears patent to the popliteal artery. Beyond the knee, the study is nondiagnostic. There is nearly complete circumferential calcification of all 3 tibial vessels to the ankle, yielding the CTa nondiagnostic. Veins: Unremarkable appearance of the venous  system. Review of the MIP images confirms the above findings. NON-VASCULAR The timing of the contrast bolus limits the evaluation of the abdomen and pelvis given the patient's lean body habitus. Hepatobiliary: Relatively unremarkable liver. Calcified stones within the gallbladder. Pancreas: Pancreas not well evaluated. Spleen: Unremarkable Adrenals/Urinary Tract:  Adrenal glands not well visualized. Right: Atrophic right kidney with no hydronephrosis. Left: Atrophic left kidney with no hydronephrosis. Urinary bladder demonstrates calcified stones at the dependent aspect. Circumferential wall thickening. Stomach/Bowel: Relatively unremarkable stomach. Stomach wall not well visualized. No abnormally distended small bowel though the bowel wall not well evaluated. No evidence of obstruction. Appendix is not visualized, however, no inflammatory changes are present adjacent to the cecum to indicate an appendicitis. Colon is decompressed. Colon wall not well evaluated. No significant stool burden or evidence of obstruction. Lymphatic: No significant adenopathy. Mesenteric: No free fluid or air. No adenopathy. Reproductive: Prostatomegaly, with estimated prostate diameter 4.9 cm Other: Cachectic abdominal wall.  No hernia. Musculoskeletal: Advanced degenerative changes of the lumbar spine. Worst degree of degenerative changes at the level of L3-L4 and L4-L5. Vacuum disc phenomenon spans all levels of the lumbar spine. Posterior disc osteophyte of L3-L4 narrows the canal by 50%. No acute displaced fracture. IMPRESSION: No CT evidence of acute aortic syndrome. Multilevel atherosclerosis (diffuse Monckeberg's pattern), including -aortic atherosclerosis with no occlusion or aneurysm. Aortic Atherosclerosis (ICD10-I70.0). -bilateral iliac atherosclerosis with no high-grade stenosis or occlusion -bilateral femoral popliteal atherosclerosis. The degree of circumferential, long segment calcification limits the sensitivity for the assessment of high-grade stenosis of the femoral popliteal segment, however, the bilateral SFA and popliteal artery appear patent to the knees -advanced bilateral tibial disease. Beyond the knees, the study is essentially nondiagnostic given the circumferential long segment calcification of the tibial arteries, form origin to ankles. Correlation with noninvasive  testing is indicated. 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 There may be developing stenosis at the origin of the IMA. 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 Advanced coronary arterial disease. Left-sided pleural effusion with partial collapse of the left upper lobe and left lower lobe. Follow-up CT imaging is indicated once the patient has been treated to assure resolution of the atelectasis and rule out the possibility of persisting nodule/mass. Trace ascites, uncertain origin. The abdomen is not well evaluated given the timing of the contrast bolus and the patient's body habitus. If there is concern for acute abdominal process (other than a vascular process) standard contrast-enhanced CT imaging should be considered. Prostatomegaly, which may account for the circumferential bladder wall thickening. Correlation with any history of bladder outlet obstruction is suggested. There are small calcifications within the lumen of the urinary bladder, potentially retained small stones. Signed, Dulcy Fanny. Dellia Nims, RPVI [facility] Radiology Electronically Signed   By: Corrie Mckusick D.O.   On: 04/25/2018 17:26   Dg Chest Port 1 View  Result Date: 04/25/2018 CLINICAL DATA:  Sudden onset abdominal and back pain during dialysis. EXAM: PORTABLE CHEST 1 VIEW COMPARISON:  None. FINDINGS: Cardiomegaly. The left heart border is obscured. No mediastinal shift. Atherosclerotic calcification of the aortic arch. Normal pulmonary vascularity. Large, loculated left pleural effusion with underlying atelectasis/consolidation. The right lung is clear. No pneumothorax. No acute osseous abnormality. IMPRESSION: 1. Large, loculated left pleural effusion with underlying atelectasis/consolidation. 2.  Aortic atherosclerosis (ICD10-I70.0). Electronically Signed   By: Titus Dubin M.D.   On: 04/25/2018 15:59   Ct Angio Chest Aorta W And/or Wo  Contrast  Result Date:  04/25/2018 CLINICAL DATA:  73 year old male with a history of right foot wound and concern for chronic limb threatening ischemia EXAM: CT ANGIOGRAPHY OF CHEST ABDOMINAL AORTA WITH ILIOFEMORAL RUNOFF TECHNIQUE: Multidetector CT imaging of the chest, abdomen, pelvis and lower extremities was performed using the standard protocol during bolus administration of intravenous contrast, following multi detector spiral CT imaging of the chest without contrast. Multiplanar CT image reconstructions and MIPs were obtained to evaluate the vascular anatomy. CONTRAST:  174mL OMNIPAQUE IOHEXOL 350 MG/ML SOLN COMPARISON:  None. FINDINGS: Chest: Cardiovascular: Heart: Cardiomegaly. No pericardial fluid/thickening. Differential attenuation of the blood pool and the myocardium on the precontrast images. Dense calcifications of left main, left anterior descending, circumflex, right coronary arteries. Aorta: No aneurysm or dissection. Atherosclerotic changes of the thoracic aorta. Pulmonary arteries: No central, lobar, segmental, or proximal subsegmental filling defects. Mediastinum/Nodes: No significant mediastinal adenopathy, with the sensitivity decreased given the patient's body habitus and timing of the contrast bolus. Unremarkable appearance of the thoracic esophagus. Unremarkable thoracic inlet. Lungs/Pleura: Central airways are clear. Right lung clear with no confluent airspace disease, pneumothorax or pleural effusion. Right-sided pleural effusion with near complete atelectasis of the right lower lobe. Apicoposterior segment of the left upper lobe is aerated. Near complete collapse of the lingula. No pneumothorax. Musculoskeletal: No acute displaced fracture. Degenerative changes of the spine. Review of the MIP images confirms the above findings. Abdomen/pelvis: VASCULAR Aorta: Atherosclerotic changes of the abdominal aorta. No aneurysm or dissection. No definite evidence of periaortic fluid. Celiac:  Atherosclerotic changes at the celiac artery origin. Branches are patent. There is preferential calcification of distal branches/small vessels of the splenic artery. SMA: SMA patent with mild atherosclerotic changes at the origin. Preferential calcification of the distal branches of the SMA distribution. Renals: Mixed calcified and soft plaque at the origin of the right renal artery with at least 50% stenosis. Dense calcifications of the distal right renal arterial distribution. On the left there is atherosclerotic changes at the origin of the renal artery. Symmetric to the right there is dense calcifications of the small vessels of the left renal artery distribution. IMA: IMA remains patent with dense calcifications at the origin. Right lower extremity: Advanced atherosclerosis of the right iliac system with no high-grade stenosis or occlusion. No aneurysm or dissection. Hypogastric artery is patent with advanced atherosclerosis of the pelvic arteries. Common femoral artery patent with mild atherosclerotic changes. Profunda femoris patent with patent thigh branches. Dense calcifications. There is circumferential calcifications of nearly the entire native SFA from the origin through the adductor canal and the popliteal artery. Degree of calcification limits the sensitivity for luminal evaluation, however, the SFA appears patent. Beyond the adductor canal, the study is nondiagnostic. There is nearly complete circumferential calcification of all 3 tibial arteries to the ankle yielding the CT a nondiagnostic. Left lower extremity: Advanced atherosclerosis of the left iliac system with no high-grade stenosis or occlusion identified. No aneurysm or dissection. Hypogastric artery patent with advanced atherosclerosis of the pelvic arteries. Common femoral artery with mild atherosclerotic changes. Profunda femoris is patent as well as the thigh branches. Advanced circumferential atherosclerotic calcifications of the thigh  branches. There is circumferential calcification of nearly the entire length of the left SFA. The degree of calcification limits the sensitivity for the evaluation of luminal narrowing, however, the SFA appears patent to the popliteal artery. Beyond the knee, the study is nondiagnostic. There is nearly complete circumferential calcification of all 3 tibial vessels to the ankle, yielding the CTa nondiagnostic. Veins: Unremarkable  appearance of the venous system. Review of the MIP images confirms the above findings. NON-VASCULAR The timing of the contrast bolus limits the evaluation of the abdomen and pelvis given the patient's lean body habitus. Hepatobiliary: Relatively unremarkable liver. Calcified stones within the gallbladder. Pancreas: Pancreas not well evaluated. Spleen: Unremarkable Adrenals/Urinary Tract: Adrenal glands not well visualized. Right: Atrophic right kidney with no hydronephrosis. Left: Atrophic left kidney with no hydronephrosis. Urinary bladder demonstrates calcified stones at the dependent aspect. Circumferential wall thickening. Stomach/Bowel: Relatively unremarkable stomach. Stomach wall not well visualized. No abnormally distended small bowel though the bowel wall not well evaluated. No evidence of obstruction. Appendix is not visualized, however, no inflammatory changes are present adjacent to the cecum to indicate an appendicitis. Colon is decompressed. Colon wall not well evaluated. No significant stool burden or evidence of obstruction. Lymphatic: No significant adenopathy. Mesenteric: No free fluid or air. No adenopathy. Reproductive: Prostatomegaly, with estimated prostate diameter 4.9 cm Other: Cachectic abdominal wall.  No hernia. Musculoskeletal: Advanced degenerative changes of the lumbar spine. Worst degree of degenerative changes at the level of L3-L4 and L4-L5. Vacuum disc phenomenon spans all levels of the lumbar spine. Posterior disc osteophyte of L3-L4 narrows the canal by  50%. No acute displaced fracture. IMPRESSION: No CT evidence of acute aortic syndrome. Multilevel atherosclerosis (diffuse Monckeberg's pattern), including -aortic atherosclerosis with no occlusion or aneurysm. Aortic Atherosclerosis (ICD10-I70.0). -bilateral iliac atherosclerosis with no high-grade stenosis or occlusion -bilateral femoral popliteal atherosclerosis. The degree of circumferential, long segment calcification limits the sensitivity for the assessment of high-grade stenosis of the femoral popliteal segment, however, the bilateral SFA and popliteal artery appear patent to the knees -advanced bilateral tibial disease. Beyond the knees, the study is essentially nondiagnostic given the circumferential long segment calcification of the tibial arteries, form origin to ankles. Correlation with noninvasive testing is indicated. 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 There may be developing stenosis at the origin of the IMA. 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 Advanced coronary arterial disease. Left-sided pleural effusion with partial collapse of the left upper lobe and left lower lobe. Follow-up CT imaging is indicated once the patient has been treated to assure resolution of the atelectasis and rule out the possibility of persisting nodule/mass. Trace ascites, uncertain origin. The abdomen is not well evaluated given the timing of the contrast bolus and the patient's body habitus. If there is concern for acute abdominal process (other than a vascular process) standard contrast-enhanced CT imaging should be considered. Prostatomegaly, which may account for the circumferential bladder wall thickening. Correlation with any history of bladder outlet obstruction is suggested. There are small calcifications within the lumen of the urinary bladder, potentially retained small stones. Signed, Dulcy Fanny. Dellia Nims, RPVI [facility]  Radiology Electronically Signed   By: Corrie Mckusick D.O.   On: 04/25/2018 17:26    EKG: Independently reviewed. Sinus rhythm, rate 70, nonspecific intraventricular conduction delay, diffuse T wave inversion unchanged with repeat EKG, no previous EKG to compare.  Assessment/Plan Principal Problem:   AMS (altered mental status) Active Problems:   ESRD (end stage renal disease) (HCC)   Diabetes (HCC)   Pleural effusion   Atherosclerosis   Elevated troponin   Hyperkalemia   Anemia of chronic disease   Severe malnutrition (HCC)  Ronald Lopez is a 73 y.o. male with medical history significant of HTN, HLD, DM, ESRD on HD, presented physical declining, abdominal pain and confusion.   Altered mental status CT  head negative for acute abnormality Likely due to toe infection or uremic encephalopathy  Right foot toe cellulitis Likely pt also has PAD Pt had an ED visit on 04/01/2018, prescribed keflex for cellulitis. IV Vancomycin and zosyn- Pharmacy to dose. F/u blood culture Pain control- Tylenol, tramadol. Vascular surgery Dr. Scot Dock consulted and will see pt.  Abdominal pain Etiology unclear, possible gastritis Supportive care  Left pleural effusion  Etiology unknown No hypoxia now Will monitor O2 status Plan for US guided thoracentesis  ESRD on HD, has been missing HD for weeks Nephrology consulted is notified in ED  Hyperkalemia K 5.3 Give a dose of Lokelma   Elevated troponin Likely demanding or due to renal failure troponin 0.1 -> 0.07, EKG with diffuse T wave inversion with repeated EKG Will trend EKG  DM2 SSI F/u Hb A1c  Anemia of chronic disease hemoglobin 10.9,  monitor  Severe malnutrition  Nutrition consult  DVT ppx: SQ Heparin         Code Status: Full code, CODE STATUS discussed with son via phone [phone], communicating with him regarding patient's multiple comorbidities and poor diagnosis, son has not thought about end-of-life care or  comfort care, insists full code and progressive work-up and treatment.  Family Communication: None at bed side. Talked to son via phone [phone]            Disposition Plan: to be determined Consults called:  Nephrology and vascluar Admission status: Inpatient/tele         Date of Service 04/25/2018    [facility]   If 7PM-7AM, please contact night-coverage [email or web address] Password [facility] 04/25/2018, 6:40 PM

## 2018-04-25 NOTE — Consult Note (Signed)
REASON FOR CONSULT:    Gangrene of the right foot.  The consult is requested by Dr. Bridgett Larsson.   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  Based on his markedly debilitated state, severe protein calorie malnutrition, and severely calcified arteries I do not think he is a candidate for any further aggressive vascular work-up.  I would not recommend arteriography and I do not think he is a candidate for surgery.  The options would be palliative care if the foot progresses versus primary above-the-knee amputation.  We will follow peripherally.  END-STAGE RENAL DISEASE: This patient is on dialysis.  He apparently had discussed wanting to withdraw with dialysis recently.  SEVERE PROTEIN CALORIE MALNUTRITION: This patient has severe protein calorie malnutrition and is markedly debilitated.  ABDOMINAL AND BACK PAIN: I reviewed the CT of the chest and abdomen and there is no evidence of aortic dissection, thoracic or abdominal aneurysm.  The etiology of his abdominal back pain is not clear.   Deitra Mayo, MD, FACS Beeper [phone] Office: [phone]   HPI:   Ronald Lopez is a pleasant 73 y.o. male, who was sent to the emergency department reportedly with abdominal pain and back pain.  Patient is on dialysis and apparently recently I decided that he wanted to stop dialysis.  Currently I am unable to obtain any history from the patient.  He does not speak [place] and is markedly debilitated.  Based on the limited Spanish that I know he was complaining of pain in the right foot.  It is not clear how long this has been going on.  However based on the extent of the wound I suspect that he has had issues with his foot for some time.  Past Medical History:  Diagnosis Date  . Diabetes mellitus without complication ([place])   .  Renal disorder   End-stage renal disease on dialysis  History reviewed. No pertinent family history.  SOCIAL HISTORY: Social History   Socioeconomic History  . Marital status: Married    Spouse name: Not on file  . Number of children: Not on file  . Years of education: Not on file  . Highest education level: Not on file  Occupational History  . Not on file  Social Needs  . Financial resource strain: Not on file  . Food insecurity:    Worry: Not on file    Inability: Not on file  . Transportation needs:    Medical: Not on file    Non-medical: Not on file  Tobacco Use  . Smoking status: Not on file  Substance and Sexual Activity  . Alcohol use: Not on file  . Drug use: Not on file  . Sexual activity: Not on file  Lifestyle  . Physical activity:    Days per week: Not on file    Minutes per session: Not on file  . Stress: Not on file  Relationships  . Social connections:    Talks on phone: Not on file    Gets together: Not on file    Attends religious service: Not on file    Active member of club or organization: Not on file    Attends meetings of clubs or organizations: Not on file    Relationship status: Not on file  . Intimate partner violence:    Fear  of current or ex partner: Not on file    Emotionally abused: Not on file    Physically abused: Not on file    Forced sexual activity: Not on file  Other Topics Concern  . Not on file  Social History Narrative  . Not on file    No Known Allergies  Current Facility-Administered Medications  Medication Dose Route Frequency Provider Last Rate Last Dose  . piperacillin-tazobactam (ZOSYN) IVPB 3.375 g  3.375 g Intravenous Once Rumbarger, Rachel L, RPH      . sodium zirconium cyclosilicate (LOKELMA) packet 5 g  5 g Oral Once Anda Latina, MD      . vancomycin (VANCOCIN) IVPB 1000 mg/200 mL premix  1,000 mg Intravenous Once Maudie Flakes, MD 200 mL/hr at 04/25/18 1849 1,000 mg at 04/25/18 1849   Current Outpatient  Medications  Medication Sig Dispense Refill  . atorvastatin (LIPITOR) 20 MG tablet Take 20 mg by mouth daily.    . carvedilol (COREG) 3.125 MG tablet Take 3.125 mg by mouth 2 (two) times daily with a meal.    . ferric citrate (AURYXIA) 1 GM 210 MG(Fe) tablet Take 420 mg by mouth 3 (three) times daily with meals.    Marland Kitchen losartan (COZAAR) 50 MG tablet Take 50 mg by mouth daily.    . sevelamer carbonate (RENVELA) 800 MG tablet Take 1,600 mg by mouth 3 (three) times daily with meals.      REVIEW OF SYSTEMS: Unable to obtain  PHYSICAL EXAM:   Vitals:   04/25/18 1745 04/25/18 1815 04/25/18 1830 04/25/18 1845  BP: (!) 124/92 (!) 146/79 136/69 127/83  Pulse: 66 65 64 69  Resp: 10 11 12 15   Temp:      TempSrc:      SpO2: 97% 100% 100% 96%    GENERAL: The patient is a well-nourished male, in no acute distress. The vital signs are documented above. CARDIAC: There is a regular rate and rhythm.  VASCULAR: The patient was moaning and it was difficult to assess for carotid bruits. On the right side, which is the side of concern, there is a palpable femoral pulse.  I cannot palpate a popliteal pulse.  He had a monophasic posterior tibial signal with the Doppler.  I could not get a dorsalis pedis signal.  The right forefoot is cooler than the left.  He has gangrenous changes to the toes of the right foot. On the left side, he has a palpable femoral pulse.  I cannot palpate a popliteal or pedal pulses.  He does have a monophasic dorsalis pedis and posterior tibial signal on the left with the Doppler. PULMONARY: There is good air exchange bilaterally without wheezing or rales. ABDOMEN: Soft and non-tender with normal pitched bowel sounds.  MUSCULOSKELETAL: There are no major deformities or cyanosis. NEUROLOGIC: No focal weakness or paresthesias are detected. SKIN: He has gangrenous changes to the toes of the right foot.    PSYCHIATRIC: The patient has a normal affect.  DATA:    Labs: His potassium  is 5.4.  GFR is 3.  Troponin was 0.10 and follow-up was 0.07. White blood cell count is 5.7.  Hemoglobin is 11.9 with hematocrit 35.  Platelets 117,000. His INR is 1.4.  CT CHEST AND ABDOMEN: There is no evidence of aortic dissection or thoracic or aortic aneurysm.  There is no significant occlusive disease of the infrarenal aorta or iliac arteries.  CT ANGIOGRAM AORTOFEMORAL RUNOFF: His CT angiogram shows patent vessels down to the  popliteal arteries.  Below that he has severe tibial artery occlusive disease.  The tibial arteries are all significantly calcified.

## 2018-04-25 NOTE — ED Provider Notes (Signed)
[facility] Emergency Department Provider Note MRN:  [id number]  Arrival date & time: 04/25/18     Chief Complaint   Abdominal Pain and Back Pain   History of Present Illness   Ronald Lopez is a 73 y.o. year-old male with a history of diabetes, ESRD presenting to the ED with chief complaint of abdominal pain and back pain.  According to son patient has been refusing dialysis for the past several days or weeks and has been refusing to eat.  Has been taking antibiotics for a foot infection.  Today family brought him to dialysis where he began experiencing acute severe abdominal and back pain.  I was unable to obtain an accurate HPI, PMH, or ROS due to the patient's altered mental status.  Review of Systems  Positive for back pain, abdominal pain, foot pain  Patient's Health History    Past Medical History:  Diagnosis Date  . Diabetes mellitus without complication ([place])   . Renal disorder     Past Surgical History:  Procedure Laterality Date  . IR AV DIALY SHUNT INTRO NEEDLE/INTRACATH INITIAL W/PTA/IMG LEFT  09/07/2017  . IR US GUIDE VASC ACCESS LEFT  09/07/2017    History reviewed. No pertinent family history.  Social History   Socioeconomic History  . Marital status: Married    Spouse name: Not on file  . Number of children: Not on file  . Years of education: Not on file  . Highest education level: Not on file  Occupational History  . Not on file  Social Needs  . Financial resource strain: Not on file  . Food insecurity:    Worry: Not on file    Inability: Not on file  . Transportation needs:    Medical: Not on file    Non-medical: Not on file  Tobacco Use  . Smoking status: Not on file  Substance and Sexual Activity  . Alcohol use: Not on file  . Drug use: Not on file  . Sexual activity: Not on file  Lifestyle  . Physical activity:    Days per week: Not on file    Minutes per session: Not on file  . Stress: Not on file  Relationships   . Social connections:    Talks on phone: Not on file    Gets together: Not on file    Attends religious service: Not on file    Active member of club or organization: Not on file    Attends meetings of clubs or organizations: Not on file    Relationship status: Not on file  . Intimate partner violence:    Fear of current or ex partner: Not on file    Emotionally abused: Not on file    Physically abused: Not on file    Forced sexual activity: Not on file  Other Topics Concern  . Not on file  Social History Narrative  . Not on file     Physical Exam  Vital Signs and Nursing Notes reviewed Vitals:   04/25/18 1900 04/25/18 1945  BP: 123/73   Pulse: 67 64  Resp: 13 17  Temp:    SpO2: 94% 100%    CONSTITUTIONAL: Chronically ill-appearing, in moderate distress due to pain, cachectic NEURO: Awake, altered and confused, not oriented EYES:  eyes equal and reactive ENT/NECK:  no LAD, no JVD CARDIO: Regular rate, well-perfused, normal S1 and S2 PULM:  CTAB no wheezing or rhonchi GI/GU:  normal bowel sounds, non-distended, non-tender MSK/SPINE:  No  gross deformities, no edema SKIN:  no rash, atraumatic; gangrenous/necrotic appearance to the right foot PSYCH:  Appropriate speech and behavior  Diagnostic and Interventional Summary    EKG Interpretation  Date/Time:  Tuesday April 25 2018 15:08:39 EDT Ventricular Rate:  70 PR Interval:    QRS Duration: 118 QT Interval:  458 QTC Calculation: 495 R Axis:   60 Text Interpretation:  Sinus rhythm Nonspecific intraventricular conduction delay Repol abnrm, global ischemia, diffuse leads Baseline wander in lead(s) V3 Confirmed by Gerlene Fee [phone]) on 04/25/2018 4:35:08 PM      Labs Reviewed  CBC - Abnormal; Notable for the following components:      Result Value   RBC 3.94 (*)    Hemoglobin 10.9 (*)    HCT 35.3 (*)    RDW 19.9 (*)    Platelets 117 (*)    All other components within normal limits  COMPREHENSIVE METABOLIC  PANEL - Abnormal; Notable for the following components:   Potassium 5.3 (*)    Chloride 96 (*)    CO2 20 (*)    BUN 92 (*)    Creatinine, Ser 14.28 (*)    Total Protein 8.9 (*)    Albumin 3.2 (*)    GFR calc non Af Amer 3 (*)    GFR calc Af Amer 3 (*)    Anion gap 24 (*)    All other components within normal limits  TROPONIN I - Abnormal; Notable for the following components:   Troponin I 0.10 (*)    All other components within normal limits  PROTIME-INR - Abnormal; Notable for the following components:   Prothrombin Time 16.6 (*)    INR 1.4 (*)    All other components within normal limits  TROPONIN I - Abnormal; Notable for the following components:   Troponin I 0.07 (*)    All other components within normal limits  TROPONIN I - Abnormal; Notable for the following components:   Troponin I 0.07 (*)    All other components within normal limits  POCT I-STAT EG7 - Abnormal; Notable for the following components:   pCO2, Ven 42.2 (*)    pO2, Ven 25.0 (*)    Potassium 5.4 (*)    Calcium, Ion 0.99 (*)    HCT 35.0 (*)    Hemoglobin 11.9 (*)    All other components within normal limits  CBG MONITORING, ED - Abnormal; Notable for the following components:   Glucose-Capillary 61 (*)    All other components within normal limits  CULTURE, BLOOD (ROUTINE X 2)  CULTURE, BLOOD (ROUTINE X 2)  LIPASE, BLOOD  LACTIC ACID, PLASMA  LACTATE DEHYDROGENASE  GLUCOSE, CAPILLARY  URINALYSIS, ROUTINE W REFLEX MICROSCOPIC  TROPONIN I  PHOSPHORUS  TSH  HEMOGLOBIN K1S  BASIC METABOLIC PANEL  CBC WITH DIFFERENTIAL/PLATELET  MAGNESIUM  HEMOGLOBIN A1C  TYPE AND SCREEN  ABO/RH    CT HEAD WO CONTRAST  Final Result    CT Angio Aortobifemoral W and/or Wo Contrast  Final Result    CT Angio Chest Aorta W and/or Wo Contrast  Final Result    DG Chest Port 1 View  Final Result    US THORACENTESIS ASP PLEURAL SPACE W/IMG GUIDE    (Results Pending)    Medications  heparin injection 5,000  Units (has no administration in time range)  acetaminophen (TYLENOL) tablet 650 mg (has no administration in time range)    Or  acetaminophen (TYLENOL) suppository 650 mg (has no administration in time range)  traMADol (ULTRAM) tablet 25 mg (has no administration in time range)  ondansetron (ZOFRAN) tablet 4 mg (has no administration in time range)    Or  ondansetron (ZOFRAN) injection 4 mg (has no administration in time range)  acidophilus (RISAQUAD) capsule 1 capsule (has no administration in time range)  sodium zirconium cyclosilicate (LOKELMA) packet 5 g (has no administration in time range)  piperacillin-tazobactam (ZOSYN) IVPB 3.375 g (has no administration in time range)  atorvastatin (LIPITOR) tablet 20 mg (has no administration in time range)  sevelamer carbonate (RENVELA) tablet 1,600 mg (has no administration in time range)  piperacillin-tazobactam (ZOSYN) IVPB 3.375 g (has no administration in time range)  insulin aspart (novoLOG) injection 0-9 Units (has no administration in time range)  vancomycin (VANCOCIN) IVPB 500 mg/100 ml premix (has no administration in time range)  Chlorhexidine Gluconate Cloth 2 % PADS 6 each (has no administration in time range)  0.9 %  sodium chloride infusion (has no administration in time range)  0.9 %  sodium chloride infusion (has no administration in time range)  fentaNYL (SUBLIMAZE) injection 50 mcg (50 mcg Intravenous Given 04/25/18 1602)  dextrose 50 % solution 50 mL (50 mLs Intravenous Given 04/25/18 1655)  iohexol (OMNIPAQUE) 350 MG/ML injection 100 mL (100 mLs Intravenous Contrast Given 04/25/18 1632)  fentaNYL (SUBLIMAZE) injection 25 mcg (25 mcg Intravenous Given 04/25/18 1746)  vancomycin (VANCOCIN) IVPB 1000 mg/200 mL premix (0 mg Intravenous Stopped 04/25/18 1952)  cefTRIAXone (ROCEPHIN) 1 g in sodium chloride 0.9 % 100 mL IVPB (0 g Intravenous Stopped 04/25/18 1846)  diphenhydrAMINE (BENADRYL) injection 25 mg (25 mg Intravenous Given  04/25/18 2118)  morphine 2 MG/ML injection 2 mg (2 mg Intravenous Given 04/25/18 2118)     Procedures Critical Care Critical Care Documentation Critical care time provided by me (excluding procedures): 38 minutes  Condition necessitating critical care: concern for limb ischemia  Components of critical care management: reviewing of prior records, laboratory and imaging interpretation, frequent re-examination and reassessment of vital signs, administration of IV fluids, IV antibiotics, discussion with consulting services    ED Course and Medical Decision Making  I have reviewed the triage vital signs and the nursing notes.  Pertinent labs & imaging results that were available during my care of the patient were reviewed by me and considered in my medical decision making (see below for details).  Concern for aortic pathology given the acute back and abdominal pain, especially with the evident necrosis to the right foot.  Discussed with vascular surgery, will obtain CTA runoff imaging.  Portable chest is concerning for diffuse opacity of the left lung.  Will add on CT of the chest.  CTA imaging is without acute vascular pathology.  Large pleural effusion.  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  Nephrology made aware of patient's need for dialysis on a nonemergent basis.  Barth Kirks. Sedonia Small, MD [place] [email or web address]  Final Clinical Impressions(s) / ED Diagnoses     ICD-10-CM   1. Limb ischemia I99.8   2. Abdominal pain R10.9 DG Chest [facility] 1 View    DG Chest Port 1 View  3. Diagnosis unknown R69 US THORACENTESIS ASP PLEURAL SPACE W/IMG GUIDE    US THORACENTESIS ASP PLEURAL SPACE [facility] GUIDE    ED Discharge Orders    None         Maudie Flakes, MD 04/25/18 2223

## 2018-04-25 NOTE — ED Triage Notes (Signed)
Pt arrives to ED from dialysis with complaints of sudden abdominal pain and back pain while getting his dialysis treatment.

## 2018-04-25 NOTE — ED Notes (Signed)
Pt became unresponsive in CT for approx 45 seconds.  MD called to CT.  Pt tolerated CT well.

## 2018-04-25 NOTE — Consult Note (Signed)
Reason for Consult: ESRD Referring Physician:  Dr Anda Latina  Chief Complaint: Toe bleeding  Dialysis orders: TTS 3.5hrs [facility] Lt [facility] 160NRe EDW 43kg 2/2.25 Hectorol 44mcg IV TIW Heparin bolus 2000 Mircera 188mcg (last given 03/16/18)  Assessment/Plan: 1. ESRD - certainly uremic but no absolute indication for emergent dialysis; d/w staff and will HD 1st shift tomorrow AM.  2. 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 Will check a phos for management. 3. Anemia - at goal 4. PAD - CTA 63ml Iohexol administered showing advanced atherosclerosis of the left iliac system, heavy calcification of the  SFA, nondiagnositic CTA of the 3 tibial runoff to the feet but advanced b/l tibial disease (circumferential calcification of the tibial vessels). 5. DM 6. Debilitated    HPI: Ronald Lopez is an 73 y.o. male  ESRD TTS at [facility] with Dr. Moshe Cipro on dialysis today for only 1hr 22min;  before that last treatments were 04/01/18 (1hr 39min), 04/04/18 (3hr 71min) and 04/08/18 (3hr 22min).  Previously had come to [facility] ED for rt foot toe rashes and bleeding noted at that time to have redness and blistering. Here for back pain, chest discomfort and feet pain, noted in ED to have worsening of the lesions on the right foot (1-3 digits) with CTA showing advanced b/l tibial vessel disease. He is confused and complaining of diarrhea as well but denies dyspnea, cough, sore throat. He is [place] with outpt HD treatments, missing treatments and signing off early as well as [place] with binders.   ROS Review of systems not obtained due to patient factors.  Chemistry and CBC: Creatinine, Ser  Date/Time Value Ref Range Status  04/25/2018 03:32 PM 14.28 (H) 0.61 - 1.24 mg/dL Final   Recent Labs  Lab 04/25/18 1532 04/25/18 1607  NA 140 139  K 5.3* 5.4*  CL 96*  --   CO2 20*  --   GLUCOSE 71  --   BUN 92*  --   CREATININE 14.28*  --   CALCIUM 8.9  --    Recent Labs  Lab 04/25/18 1532  04/25/18 1607  WBC 5.7  --   HGB 10.9* 11.9*  HCT 35.3* 35.0*  MCV 89.6  --   PLT 117*  --    Liver Function Tests: Recent Labs  Lab 04/25/18 1532  AST 16  ALT 8  ALKPHOS 77  BILITOT 0.9  PROT 8.9*  ALBUMIN 3.2*   Recent Labs  Lab 04/25/18 1532  LIPASE 25   No results for input(s): AMMONIA in the last 168 hours. Cardiac Enzymes: Recent Labs  Lab 04/25/18 1532  TROPONINI 0.10*   Iron Studies: No results for input(s): IRON, TIBC, TRANSFERRIN, FERRITIN in the last 72 hours. PT/INR: @LABRCNTIP (inr:5)  Xrays/Other Studies: ) Results for orders placed or performed during the hospital encounter of 04/25/18 (from the past 48 hour(s))  CBC     Status: Abnormal   Collection Time: 04/25/18  3:32 PM  Result Value Ref Range   WBC 5.7 4.0 - 10.5 K/uL   RBC 3.94 (L) 4.22 - 5.81 MIL/uL   Hemoglobin 10.9 (L) 13.0 - 17.0 g/dL   HCT 35.3 (L) 39.0 - 52.0 %   MCV 89.6 80.0 - 100.0 fL   MCH 27.7 26.0 - 34.0 pg   MCHC 30.9 30.0 - 36.0 g/dL   RDW 19.9 (H) 11.5 - 15.5 %   Platelets 117 (L) 150 - 400 K/uL    Comment: REPEATED TO VERIFY SPECIMEN CHECKED FOR CLOTS  Immature Platelet Fraction may be clinically indicated, consider ordering this additional test [id number]    nRBC 0.0 0.0 - 0.2 %    Comment: Performed at [facility], [place] [address] [place], [place] [address]  Comprehensive metabolic panel     Status: Abnormal   Collection Time: 04/25/18  3:32 PM  Result Value Ref Range   Sodium 140 135 - 145 mmol/L   Potassium 5.3 (H) 3.5 - 5.1 mmol/L   Chloride 96 (L) 98 - 111 mmol/L   CO2 20 (L) 22 - 32 mmol/L   Glucose, Bld 71 70 - 99 mg/dL   BUN 92 (H) 8 - 23 mg/dL   Creatinine, Ser 14.28 (H) 0.61 - 1.24 mg/dL   Calcium 8.9 8.9 - 10.3 mg/dL   Total Protein 8.9 (H) 6.5 - 8.1 g/dL   Albumin 3.2 (L) 3.5 - 5.0 g/dL   AST 16 15 - 41 U/L   ALT 8 0 - 44 U/L   Alkaline Phosphatase 77 38 - 126 U/L   Total Bilirubin 0.9 0.3 - 1.2 mg/dL   GFR calc non Af Amer 3 (L) >60  mL/min   GFR calc Af Amer 3 (L) >60 mL/min   Anion gap 24 (H) 5 - 15    Comment: Performed at [facility], [place] [address], [place], [place] [address]  Troponin I - ONCE - STAT     Status: Abnormal   Collection Time: 04/25/18  3:32 PM  Result Value Ref Range   Troponin I 0.10 (HH) <0.03 ng/mL    Comment: CRITICAL RESULT CALLED TO, READ BACK BY AND VERIFIED WITH: Veneda Melter [id number] 04/25/2018 D BRADLEY Performed at [facility], [place] [address], [place], [place] [address]   Lipase, blood     Status: None   Collection Time: 04/25/18  3:32 PM  Result Value Ref Range   Lipase 25 11 - 51 U/L    Comment: Performed at [facility], [place] [address], [place], [place] [address]  Lactic acid, plasma     Status: None   Collection Time: 04/25/18  3:32 PM  Result Value Ref Range   Lactic Acid, Venous 1.6 0.5 - 1.9 mmol/L    Comment: Performed at [place] [address], [place], [place] [address]  Protime-INR     Status: Abnormal   Collection Time: 04/25/18  3:32 PM  Result Value Ref Range   Prothrombin Time 16.6 (H) 11.4 - 15.2 seconds   INR 1.4 (H) 0.8 - 1.2    Comment: (NOTE) INR goal varies based on device and disease states. Performed at [facility], [place] [address], [place], [place] [address]   Type and screen [place]     Status: None   Collection Time: 04/25/18  3:57 PM  Result Value Ref Range   ABO/RH(D) O POS    Antibody Screen NEG    Sample Expiration      04/28/2018 Performed at [facility], [place] [address] [place], [place] [address]   ABO/Rh     Status: None   Collection Time: 04/25/18  3:57 PM  Result Value Ref Range   ABO/RH(D)      O POS Performed at [facility] [address], [place],  [address]   POCT I-Stat EG7     Status: Abnormal   Collection Time: 04/25/18  4:07 PM  Result Value Ref Range   pH,  Ven 7.378 7.250 - 7.430   pCO2, Ven 42.2 (L) 44.0 - 60.0 mmHg   pO2, Ven 25.0 (LL) 32.0 -  45.0 mmHg   Bicarbonate 24.8 20.0 - 28.0 mmol/L   TCO2 26 22 - 32 mmol/L   O2 Saturation 43.0 %   Sodium 139 135 - 145 mmol/L   Potassium 5.4 (H) 3.5 - 5.1 mmol/L   Calcium, Ion 0.99 (L) 1.15 - 1.40 mmol/L   HCT 35.0 (L) 39.0 - 52.0 %   Hemoglobin 11.9 (L) 13.0 - 17.0 g/dL   Patient temperature HIDE    Sample type VENOUS    Comment NOTIFIED PHYSICIAN   CBG monitoring, ED     Status: Abnormal   Collection Time: 04/25/18  4:25 PM  Result Value Ref Range   Glucose-Capillary 61 (L) 70 - 99 mg/dL   Comment 1 Notify RN    Comment 2 Document in Chart    Ct Head Wo Contrast  Result Date: 04/25/2018 CLINICAL DATA:  Altered level of consciousness EXAM: CT HEAD WITHOUT CONTRAST TECHNIQUE: Contiguous axial images were obtained from the base of the skull through the vertex without intravenous contrast. COMPARISON:  None. FINDINGS: Brain: No evidence of acute infarction, hemorrhage, extra-axial collection, ventriculomegaly, or mass effect. Generalized cerebral atrophy. Periventricular white matter low attenuation likely secondary to microangiopathy. Vascular: Cerebrovascular atherosclerotic calcifications are noted. Skull: Negative for fracture or focal lesion. Sinuses/Orbits: Visualized portions of the orbits are unremarkable. Mastoid sinuses are clear. Right maxillary sinus mucosal thickening. Other: None. IMPRESSION: No acute intracranial pathology. Electronically Signed   By: Kathreen Devoid   On: 04/25/2018 16:59   Ct Angio Aortobifemoral W And/or Wo Contrast  Result Date: 04/25/2018 CLINICAL DATA:  73 year old male with a history of right foot wound and concern for chronic limb threatening ischemia EXAM: CT ANGIOGRAPHY OF CHEST ABDOMINAL AORTA WITH ILIOFEMORAL RUNOFF TECHNIQUE: Multidetector CT imaging of the chest, abdomen, pelvis and lower extremities was performed using the standard protocol during bolus administration of intravenous contrast, following multi detector spiral CT imaging of the chest  without contrast. Multiplanar CT image reconstructions and MIPs were obtained to evaluate the vascular anatomy. CONTRAST:  136mL OMNIPAQUE IOHEXOL 350 MG/ML SOLN COMPARISON:  None. FINDINGS: Chest: Cardiovascular: Heart: Cardiomegaly. No pericardial fluid/thickening. Differential attenuation of the blood pool and the myocardium on the precontrast images. Dense calcifications of left main, left anterior descending, circumflex, right coronary arteries. Aorta: No aneurysm or dissection. Atherosclerotic changes of the thoracic aorta. Pulmonary arteries: No central, lobar, segmental, or proximal subsegmental filling defects. Mediastinum/Nodes: No significant mediastinal adenopathy, with the sensitivity decreased given the patient's body habitus and timing of the contrast bolus. Unremarkable appearance of the thoracic esophagus. Unremarkable thoracic inlet. Lungs/Pleura: Central airways are clear. Right lung clear with no confluent airspace disease, pneumothorax or pleural effusion. Right-sided pleural effusion with near complete atelectasis of the right lower lobe. Apicoposterior segment of the left upper lobe is aerated. Near complete collapse of the lingula. No pneumothorax. Musculoskeletal: No acute displaced fracture. Degenerative changes of the spine. Review of the MIP images confirms the above findings. Abdomen/pelvis: VASCULAR Aorta: Atherosclerotic changes of the abdominal aorta. No aneurysm or dissection. No definite evidence of periaortic fluid. Celiac: Atherosclerotic changes at the celiac artery origin. Branches are patent. There is preferential calcification of distal branches/small vessels of the splenic artery. SMA: SMA patent with mild atherosclerotic changes at the origin. Preferential calcification of the distal branches of the SMA distribution. Renals: Mixed calcified and soft plaque at the origin of  the right renal artery with at least 50% stenosis. Dense calcifications of the distal right renal  arterial distribution. On the left there is atherosclerotic changes at the origin of the renal artery. Symmetric to the right there is dense calcifications of the small vessels of the left renal artery distribution. IMA: IMA remains patent with dense calcifications at the origin. Right lower extremity: Advanced atherosclerosis of the right iliac system with no high-grade stenosis or occlusion. No aneurysm or dissection. Hypogastric artery is patent with advanced atherosclerosis of the pelvic arteries. Common femoral artery patent with mild atherosclerotic changes. Profunda femoris patent with patent thigh branches. Dense calcifications. There is circumferential calcifications of nearly the entire native SFA from the origin through the adductor canal and the popliteal artery. Degree of calcification limits the sensitivity for luminal evaluation, however, the SFA appears patent. Beyond the adductor canal, the study is nondiagnostic. There is nearly complete circumferential calcification of all 3 tibial arteries to the ankle yielding the CT a nondiagnostic. Left lower extremity: Advanced atherosclerosis of the left iliac system with no high-grade stenosis or occlusion identified. No aneurysm or dissection. Hypogastric artery patent with advanced atherosclerosis of the pelvic arteries. Common femoral artery with mild atherosclerotic changes. Profunda femoris is patent as well as the thigh branches. Advanced circumferential atherosclerotic calcifications of the thigh branches. There is circumferential calcification of nearly the entire length of the left SFA. The degree of calcification limits the sensitivity for the evaluation of luminal narrowing, however, the SFA appears patent to the popliteal artery. Beyond the knee, the study is nondiagnostic. There is nearly complete circumferential calcification of all 3 tibial vessels to the ankle, yielding the CTa nondiagnostic. Veins: Unremarkable appearance of the venous  system. Review of the MIP images confirms the above findings. NON-VASCULAR The timing of the contrast bolus limits the evaluation of the abdomen and pelvis given the patient's lean body habitus. Hepatobiliary: Relatively unremarkable liver. Calcified stones within the gallbladder. Pancreas: Pancreas not well evaluated. Spleen: Unremarkable Adrenals/Urinary Tract: Adrenal glands not well visualized. Right: Atrophic right kidney with no hydronephrosis. Left: Atrophic left kidney with no hydronephrosis. Urinary bladder demonstrates calcified stones at the dependent aspect. Circumferential wall thickening. Stomach/Bowel: Relatively unremarkable stomach. Stomach wall not well visualized. No abnormally distended small bowel though the bowel wall not well evaluated. No evidence of obstruction. Appendix is not visualized, however, no inflammatory changes are present adjacent to the cecum to indicate an appendicitis. Colon is decompressed. Colon wall not well evaluated. No significant stool burden or evidence of obstruction. Lymphatic: No significant adenopathy. Mesenteric: No free fluid or air. No adenopathy. Reproductive: Prostatomegaly, with estimated prostate diameter 4.9 cm Other: Cachectic abdominal wall.  No hernia. Musculoskeletal: Advanced degenerative changes of the lumbar spine. Worst degree of degenerative changes at the level of L3-L4 and L4-L5. Vacuum disc phenomenon spans all levels of the lumbar spine. Posterior disc osteophyte of L3-L4 narrows the canal by 50%. No acute displaced fracture. IMPRESSION: No CT evidence of acute aortic syndrome. Multilevel atherosclerosis (diffuse Monckeberg's pattern), including -aortic atherosclerosis with no occlusion or aneurysm. Aortic Atherosclerosis (ICD10-I70.0). -bilateral iliac atherosclerosis with no high-grade stenosis or occlusion -bilateral femoral popliteal atherosclerosis. The degree of circumferential, long segment calcification limits the sensitivity for the  assessment of high-grade stenosis of the femoral popliteal segment, however, the bilateral SFA and popliteal artery appear patent to the knees -advanced bilateral tibial disease. Beyond the knees, the study is essentially nondiagnostic given the circumferential long segment calcification of the tibial arteries, form origin to  ankles. Correlation with noninvasive testing is indicated. 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 There may be developing stenosis at the origin of the IMA. 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 Advanced coronary arterial disease. Left-sided pleural effusion with partial collapse of the left upper lobe and left lower lobe. Follow-up CT imaging is indicated once the patient has been treated to assure resolution of the atelectasis and rule out the possibility of persisting nodule/mass. Trace ascites, uncertain origin. The abdomen is not well evaluated given the timing of the contrast bolus and the patient's body habitus. If there is concern for acute abdominal process (other than a vascular process) standard contrast-enhanced CT imaging should be considered. Prostatomegaly, which may account for the circumferential bladder wall thickening. Correlation with any history of bladder outlet obstruction is suggested. There are small calcifications within the lumen of the urinary bladder, potentially retained small stones. Signed, Dulcy Fanny. Dellia Nims, RPVI [facility] Radiology Electronically Signed   By: Corrie Mckusick D.O.   On: 04/25/2018 17:26   Dg Chest Port 1 View  Result Date: 04/25/2018 CLINICAL DATA:  Sudden onset abdominal and back pain during dialysis. EXAM: PORTABLE CHEST 1 VIEW COMPARISON:  None. FINDINGS: Cardiomegaly. The left heart border is obscured. No mediastinal shift. Atherosclerotic calcification of the aortic arch. Normal pulmonary vascularity. Large, loculated left pleural effusion with  underlying atelectasis/consolidation. The right lung is clear. No pneumothorax. No acute osseous abnormality. IMPRESSION: 1. Large, loculated left pleural effusion with underlying atelectasis/consolidation. 2.  Aortic atherosclerosis (ICD10-I70.0). Electronically Signed   By: Titus Dubin M.D.   On: 04/25/2018 15:59   Ct Angio Chest Aorta W And/or Wo Contrast  Result Date: 04/25/2018 CLINICAL DATA:  73 year old male with a history of right foot wound and concern for chronic limb threatening ischemia EXAM: CT ANGIOGRAPHY OF CHEST ABDOMINAL AORTA WITH ILIOFEMORAL RUNOFF TECHNIQUE: Multidetector CT imaging of the chest, abdomen, pelvis and lower extremities was performed using the standard protocol during bolus administration of intravenous contrast, following multi detector spiral CT imaging of the chest without contrast. Multiplanar CT image reconstructions and MIPs were obtained to evaluate the vascular anatomy. CONTRAST:  166mL OMNIPAQUE IOHEXOL 350 MG/ML SOLN COMPARISON:  None. FINDINGS: Chest: Cardiovascular: Heart: Cardiomegaly. No pericardial fluid/thickening. Differential attenuation of the blood pool and the myocardium on the precontrast images. Dense calcifications of left main, left anterior descending, circumflex, right coronary arteries. Aorta: No aneurysm or dissection. Atherosclerotic changes of the thoracic aorta. Pulmonary arteries: No central, lobar, segmental, or proximal subsegmental filling defects. Mediastinum/Nodes: No significant mediastinal adenopathy, with the sensitivity decreased given the patient's body habitus and timing of the contrast bolus. Unremarkable appearance of the thoracic esophagus. Unremarkable thoracic inlet. Lungs/Pleura: Central airways are clear. Right lung clear with no confluent airspace disease, pneumothorax or pleural effusion. Right-sided pleural effusion with near complete atelectasis of the right lower lobe. Apicoposterior segment of the left upper lobe is  aerated. Near complete collapse of the lingula. No pneumothorax. Musculoskeletal: No acute displaced fracture. Degenerative changes of the spine. Review of the MIP images confirms the above findings. Abdomen/pelvis: VASCULAR Aorta: Atherosclerotic changes of the abdominal aorta. No aneurysm or dissection. No definite evidence of periaortic fluid. Celiac: Atherosclerotic changes at the celiac artery origin. Branches are patent. There is preferential calcification of distal branches/small vessels of the splenic artery. SMA: SMA patent with mild atherosclerotic changes at the origin. Preferential calcification of the distal branches of the SMA distribution. Renals: Mixed calcified and soft plaque  at the origin of the right renal artery with at least 50% stenosis. Dense calcifications of the distal right renal arterial distribution. On the left there is atherosclerotic changes at the origin of the renal artery. Symmetric to the right there is dense calcifications of the small vessels of the left renal artery distribution. IMA: IMA remains patent with dense calcifications at the origin. Right lower extremity: Advanced atherosclerosis of the right iliac system with no high-grade stenosis or occlusion. No aneurysm or dissection. Hypogastric artery is patent with advanced atherosclerosis of the pelvic arteries. Common femoral artery patent with mild atherosclerotic changes. Profunda femoris patent with patent thigh branches. Dense calcifications. There is circumferential calcifications of nearly the entire native SFA from the origin through the adductor canal and the popliteal artery. Degree of calcification limits the sensitivity for luminal evaluation, however, the SFA appears patent. Beyond the adductor canal, the study is nondiagnostic. There is nearly complete circumferential calcification of all 3 tibial arteries to the ankle yielding the CT a nondiagnostic. Left lower extremity: Advanced atherosclerosis of the left  iliac system with no high-grade stenosis or occlusion identified. No aneurysm or dissection. Hypogastric artery patent with advanced atherosclerosis of the pelvic arteries. Common femoral artery with mild atherosclerotic changes. Profunda femoris is patent as well as the thigh branches. Advanced circumferential atherosclerotic calcifications of the thigh branches. There is circumferential calcification of nearly the entire length of the left SFA. The degree of calcification limits the sensitivity for the evaluation of luminal narrowing, however, the SFA appears patent to the popliteal artery. Beyond the knee, the study is nondiagnostic. There is nearly complete circumferential calcification of all 3 tibial vessels to the ankle, yielding the CTa nondiagnostic. Veins: Unremarkable appearance of the venous system. Review of the MIP images confirms the above findings. NON-VASCULAR The timing of the contrast bolus limits the evaluation of the abdomen and pelvis given the patient's lean body habitus. Hepatobiliary: Relatively unremarkable liver. Calcified stones within the gallbladder. Pancreas: Pancreas not well evaluated. Spleen: Unremarkable Adrenals/Urinary Tract: Adrenal glands not well visualized. Right: Atrophic right kidney with no hydronephrosis. Left: Atrophic left kidney with no hydronephrosis. Urinary bladder demonstrates calcified stones at the dependent aspect. Circumferential wall thickening. Stomach/Bowel: Relatively unremarkable stomach. Stomach wall not well visualized. No abnormally distended small bowel though the bowel wall not well evaluated. No evidence of obstruction. Appendix is not visualized, however, no inflammatory changes are present adjacent to the cecum to indicate an appendicitis. Colon is decompressed. Colon wall not well evaluated. No significant stool burden or evidence of obstruction. Lymphatic: No significant adenopathy. Mesenteric: No free fluid or air. No adenopathy. Reproductive:  Prostatomegaly, with estimated prostate diameter 4.9 cm Other: Cachectic abdominal wall.  No hernia. Musculoskeletal: Advanced degenerative changes of the lumbar spine. Worst degree of degenerative changes at the level of L3-L4 and L4-L5. Vacuum disc phenomenon spans all levels of the lumbar spine. Posterior disc osteophyte of L3-L4 narrows the canal by 50%. No acute displaced fracture. IMPRESSION: No CT evidence of acute aortic syndrome. Multilevel atherosclerosis (diffuse Monckeberg's pattern), including -aortic atherosclerosis with no occlusion or aneurysm. Aortic Atherosclerosis (ICD10-I70.0). -bilateral iliac atherosclerosis with no high-grade stenosis or occlusion -bilateral femoral popliteal atherosclerosis. The degree of circumferential, long segment calcification limits the sensitivity for the assessment of high-grade stenosis of the femoral popliteal segment, however, the bilateral SFA and popliteal artery appear patent to the knees -advanced bilateral tibial disease. Beyond the knees, the study is essentially nondiagnostic given the circumferential long segment calcification of the tibial arteries,  form origin to ankles. Correlation with noninvasive testing is indicated. 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 There may be developing stenosis at the origin of the IMA. 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 Advanced coronary arterial disease. Left-sided pleural effusion with partial collapse of the left upper lobe and left lower lobe. Follow-up CT imaging is indicated once the patient has been treated to assure resolution of the atelectasis and rule out the possibility of persisting nodule/mass. Trace ascites, uncertain origin. The abdomen is not well evaluated given the timing of the contrast bolus and the patient's body habitus. If there is concern for acute abdominal process (other than a vascular process) standard contrast-enhanced CT imaging should be considered.  Prostatomegaly, which may account for the circumferential bladder wall thickening. Correlation with any history of bladder outlet obstruction is suggested. There are small calcifications within the lumen of the urinary bladder, potentially retained small stones. Signed, Dulcy Fanny. Dellia Nims, RPVI [facility] Radiology Electronically Signed   By: Corrie Mckusick D.O.   On: 04/25/2018 17:26    PMH:   Past Medical History:  Diagnosis Date  . Diabetes mellitus without complication ([place])   . Renal disorder     PSH:   Past Surgical History:  Procedure Laterality Date  . IR AV DIALY SHUNT INTRO NEEDLE/INTRACATH INITIAL W/PTA/IMG LEFT  09/07/2017  . IR US GUIDE VASC ACCESS LEFT  09/07/2017    Allergies: No Known Allergies  Medications:   Prior to Admission medications   Medication Sig Start Date End Date Taking? Authorizing Provider  atorvastatin (LIPITOR) 20 MG tablet Take 20 mg by mouth daily.    [provider]  carvedilol (COREG) 3.125 MG tablet Take 3.125 mg by mouth 2 (two) times daily with a meal.    [provider]  ferric citrate (AURYXIA) 1 GM 210 MG(Fe) tablet Take 420 mg by mouth 3 (three) times daily with meals.    [provider]  losartan (COZAAR) 50 MG tablet Take 50 mg by mouth daily.    [provider]  sevelamer carbonate (RENVELA) 800 MG tablet Take 1,600 mg by mouth 3 (three) times daily with meals.    [provider]    Discontinued Meds:   Medications Discontinued During This Encounter  Medication Reason  . haloperidol lactate (HALDOL) injection 2 mg     Social History:  has no history on file for tobacco, alcohol, and drug.  Family History:  History reviewed. No pertinent family history.  Blood pressure (!) 124/92, pulse 66, temperature (!) 95.8 F (35.4 C), temperature source Rectal, resp. rate 10, SpO2 97 %. General appearance: alert, uncooperative and confused Head:  Normocephalic, without obvious abnormality, atraumatic Eyes: blind right eye Neck: no adenopathy, supple, symmetrical, trachea midline and thyroid not enlarged, symmetric, no tenderness/mass/nodules Back: symmetric, no curvature. ROM normal. No CVA tenderness. Resp: poor air movement Chest wall: no tenderness Cardio: regular rate and rhythm, S1, S2 normal, no murmur, click, rub or gallop GI: soft, non-tender; bowel sounds normal; no masses,  no organomegaly Extremities: right foot digits 1-3 necrotic Pulses: not palpable DP Skin: discolored toes (rt foot 1-3) Lymph nodes: Cervical adenopathy: none Access: Lt Cimino +bruit       Dwana Melena, MD 04/25/2018, 6:50 PM

## 2018-04-26 ENCOUNTER — Inpatient Hospital Stay (HOSPITAL_COMMUNITY): Payer: Self-pay

## 2018-04-26 ENCOUNTER — Encounter (HOSPITAL_COMMUNITY): Payer: Self-pay | Admitting: Radiology

## 2018-04-26 DIAGNOSIS — R7989 Other specified abnormal findings of blood chemistry: Secondary | ICD-10-CM

## 2018-04-26 DIAGNOSIS — I96 Gangrene, not elsewhere classified: Secondary | ICD-10-CM

## 2018-04-26 DIAGNOSIS — E43 Unspecified severe protein-calorie malnutrition: Secondary | ICD-10-CM

## 2018-04-26 DIAGNOSIS — G9341 Metabolic encephalopathy: Secondary | ICD-10-CM

## 2018-04-26 HISTORY — PX: IR THORACENTESIS ASP PLEURAL SPACE W/IMG GUIDE: IMG5380

## 2018-04-26 LAB — GLUCOSE, PLEURAL OR PERITONEAL FLUID: Glucose, Fluid: 88 mg/dL

## 2018-04-26 LAB — HEMOGLOBIN A1C
Hgb A1c MFr Bld: 6 % — ABNORMAL HIGH (ref 4.8–5.6)
Mean Plasma Glucose: 125.5 mg/dL

## 2018-04-26 LAB — PROTEIN, PLEURAL OR PERITONEAL FLUID: Total protein, fluid: 3.7 g/dL

## 2018-04-26 LAB — CBC WITH DIFFERENTIAL/PLATELET
Abs Immature Granulocytes: 0.02 10*3/uL (ref 0.00–0.07)
Basophils Absolute: 0.1 10*3/uL (ref 0.0–0.1)
Basophils Relative: 1 %
Eosinophils Absolute: 0.4 10*3/uL (ref 0.0–0.5)
Eosinophils Relative: 8 %
HCT: 31.7 % — ABNORMAL LOW (ref 39.0–52.0)
Hemoglobin: 9.9 g/dL — ABNORMAL LOW (ref 13.0–17.0)
Immature Granulocytes: 0 %
Lymphocytes Relative: 12 %
Lymphs Abs: 0.6 10*3/uL — ABNORMAL LOW (ref 0.7–4.0)
MCH: 27.4 pg (ref 26.0–34.0)
MCHC: 31.2 g/dL (ref 30.0–36.0)
MCV: 87.8 fL (ref 80.0–100.0)
Monocytes Absolute: 0.6 10*3/uL (ref 0.1–1.0)
Monocytes Relative: 12 %
Neutro Abs: 3.5 10*3/uL (ref 1.7–7.7)
Neutrophils Relative %: 67 %
Platelets: 98 10*3/uL — ABNORMAL LOW (ref 150–400)
RBC: 3.61 MIL/uL — ABNORMAL LOW (ref 4.22–5.81)
RDW: 19.9 % — ABNORMAL HIGH (ref 11.5–15.5)
WBC: 5.2 10*3/uL (ref 4.0–10.5)
nRBC: 0 % (ref 0.0–0.2)

## 2018-04-26 LAB — LACTATE DEHYDROGENASE, PLEURAL OR PERITONEAL FLUID: LD, Fluid: 88 U/L — ABNORMAL HIGH (ref 3–23)

## 2018-04-26 LAB — BASIC METABOLIC PANEL
Anion gap: 20 — ABNORMAL HIGH (ref 5–15)
BUN: 92 mg/dL — ABNORMAL HIGH (ref 8–23)
CO2: 21 mmol/L — ABNORMAL LOW (ref 22–32)
Calcium: 8.4 mg/dL — ABNORMAL LOW (ref 8.9–10.3)
Chloride: 96 mmol/L — ABNORMAL LOW (ref 98–111)
Creatinine, Ser: 13.58 mg/dL — ABNORMAL HIGH (ref 0.61–1.24)
GFR calc Af Amer: 4 mL/min — ABNORMAL LOW (ref >60)
GFR calc non Af Amer: 3 mL/min — ABNORMAL LOW (ref >60)
Glucose, Bld: 64 mg/dL — ABNORMAL LOW (ref 70–99)
Potassium: 5.2 mmol/L — ABNORMAL HIGH (ref 3.5–5.1)
Sodium: 137 mmol/L (ref 135–145)

## 2018-04-26 LAB — GLUCOSE, CAPILLARY
Glucose-Capillary: 44 mg/dL — CL (ref 70–99)
Glucose-Capillary: 140 mg/dL — ABNORMAL HIGH (ref 70–99)
Glucose-Capillary: 62 mg/dL — ABNORMAL LOW (ref 70–99)
Glucose-Capillary: 142 mg/dL — ABNORMAL HIGH (ref 70–99)
Glucose-Capillary: 57 mg/dL — ABNORMAL LOW (ref 70–99)
Glucose-Capillary: 71 mg/dL (ref 70–99)
Glucose-Capillary: 72 mg/dL (ref 70–99)

## 2018-04-26 LAB — BODY FLUID CELL COUNT WITH DIFFERENTIAL
Eos, Fluid: 0 %
Lymphs, Fluid: 93 %
Monocyte-Macrophage-Serous Fluid: 6 % — ABNORMAL LOW (ref 50–90)
Neutrophil Count, Fluid: 1 % (ref 0–25)
Total Nucleated Cell Count, Fluid: 360 cu mm (ref 0–1000)

## 2018-04-26 LAB — GRAM STAIN

## 2018-04-26 LAB — PHOSPHORUS: Phosphorus: 9.3 mg/dL — ABNORMAL HIGH (ref 2.5–4.6)

## 2018-04-26 LAB — MAGNESIUM: Magnesium: 2.9 mg/dL — ABNORMAL HIGH (ref 1.7–2.4)

## 2018-04-26 LAB — TSH: TSH: 5.252 u[IU]/mL — ABNORMAL HIGH (ref 0.350–4.500)

## 2018-04-26 MED ORDER — LIDOCAINE-PRILOCAINE 2.5-2.5 % EX CREA: 1.0000 "application " | TOPICAL_CREAM | CUTANEOUS | Status: DC | PRN

## 2018-04-26 MED ORDER — DEXTROSE 50 % IV SOLN
12.5000 g | INTRAVENOUS | Status: AC
  Administered 2018-04-26: 13:00:00 12.5 g via INTRAVENOUS
  Filled 2018-04-26: qty 50

## 2018-04-26 MED ORDER — SODIUM CHLORIDE 0.9 % IV SOLN: 100.0000 mL | INTRAVENOUS | Status: DC | PRN

## 2018-04-26 MED ORDER — HEPARIN SODIUM (PORCINE) 1000 UNIT/ML DIALYSIS: 1000.0000 [IU] | INTRAMUSCULAR | Status: DC | PRN

## 2018-04-26 MED ORDER — LIDOCAINE HCL (PF) 1 % IJ SOLN: 5.0000 mL | INTRAMUSCULAR | Status: DC | PRN

## 2018-04-26 MED ORDER — DIPHENHYDRAMINE HCL 25 MG PO CAPS
25.0000 mg | ORAL_CAPSULE | Freq: Three times a day (TID) | ORAL | Status: DC | PRN
  Administered 2018-04-26 – 2018-04-27 (×2): 25 mg via ORAL
  Filled 2018-04-26 (×2): qty 1

## 2018-04-26 MED ORDER — ALTEPLASE 2 MG IJ SOLR: 2.0000 mg | Freq: Once | INTRAMUSCULAR | Status: DC | PRN

## 2018-04-26 MED ORDER — HEPARIN SODIUM (PORCINE) 1000 UNIT/ML DIALYSIS: 2000.0000 [IU] | Freq: Once | INTRAMUSCULAR | Status: DC

## 2018-04-26 MED ORDER — HEPARIN SODIUM (PORCINE) 1000 UNIT/ML DIALYSIS
1000.0000 [IU] | INTRAMUSCULAR | Status: DC | PRN
  Filled 2018-04-26: qty 1

## 2018-04-26 MED ORDER — DIPHENHYDRAMINE HCL 50 MG/ML IJ SOLN
12.5000 mg | Freq: Once | INTRAMUSCULAR | Status: AC
  Administered 2018-04-26: 13:00:00 12.5 mg via INTRAVENOUS
  Filled 2018-04-26: qty 1

## 2018-04-26 MED ORDER — DEXTROSE 5 % IV SOLN
INTRAVENOUS | Status: DC
  Administered 2018-04-26: 11:00:00 via INTRAVENOUS

## 2018-04-26 MED ORDER — PENTAFLUOROPROP-TETRAFLUOROETH EX AERO: 1.0000 "application " | INHALATION_SPRAY | CUTANEOUS | Status: DC | PRN

## 2018-04-26 MED ORDER — HEPARIN SODIUM (PORCINE) 1000 UNIT/ML DIALYSIS
2000.0000 [IU] | Freq: Once | INTRAMUSCULAR | Status: DC
  Filled 2018-04-26: qty 2

## 2018-04-26 MED ORDER — RENA-VITE PO TABS
1.0000 | ORAL_TABLET | Freq: Every day | ORAL | Status: DC
  Administered 2018-04-26: 23:00:00 1 via ORAL
  Filled 2018-04-26: qty 1

## 2018-04-26 MED ORDER — PANTOPRAZOLE SODIUM 40 MG IV SOLR
40.0000 mg | INTRAVENOUS | Status: DC
  Administered 2018-04-26 – 2018-04-27 (×2): 40 mg via INTRAVENOUS
  Filled 2018-04-26 (×2): qty 40

## 2018-04-26 MED ORDER — LIDOCAINE HCL 1 % IJ SOLN
INTRAMUSCULAR | Status: AC
  Filled 2018-04-26: qty 20

## 2018-04-26 MED ORDER — DEXTROSE 50 % IV SOLN
25.0000 g | INTRAVENOUS | Status: AC
  Administered 2018-04-26: 06:00:00 25 g via INTRAVENOUS
  Filled 2018-04-26: qty 50

## 2018-04-26 MED ORDER — ENSURE ENLIVE PO LIQD
237.0000 mL | Freq: Two times a day (BID) | ORAL | Status: DC
  Administered 2018-04-27: 10:00:00 237 mL via ORAL

## 2018-04-26 MED ORDER — VANCOMYCIN HCL 500 MG IV SOLR
500.0000 mg | INTRAVENOUS | Status: DC
  Filled 2018-04-26: qty 500

## 2018-04-26 MED ORDER — CHLORHEXIDINE GLUCONATE CLOTH 2 % EX PADS: 6.0000 | MEDICATED_PAD | Freq: Every day | CUTANEOUS | Status: DC

## 2018-04-26 MED ORDER — LIDOCAINE-PRILOCAINE 2.5-2.5 % EX CREA
1.0000 "application " | TOPICAL_CREAM | CUTANEOUS | Status: DC | PRN
  Filled 2018-04-26: qty 5

## 2018-04-26 MED ORDER — LIDOCAINE HCL (PF) 1 % IJ SOLN
INTRAMUSCULAR | Status: DC | PRN
  Administered 2018-04-26: 10 mL

## 2018-04-26 NOTE — Progress Notes (Signed)
Patient ID: Ronald Lopez, male   DOB: [birth year], 73 y.o.   MRN: [id number]  PROGRESS NOTE    Federick Levene  WUJ:[id number] DOB: 1945-05-19 DOA: 04/25/2018 PCP: Patient, No Pcp Per   Brief Narrative:  73 year old male with history of hypertension, hyperlipidemia, diabetes mellitus, end-stage renal disease on hemodialysis presented on 04/25/2018 with abdominal pain and confusion.  He was found to have right foot necrotic changes.  Chest x-ray showed a large, loculated left pleural effusion with underlying atelectasis/consolidation.  Chest CTA and CT angios aortobifemoral showed no PE or dissection, showed extensive atherosclerosis without significant obstruction, and large left pleural effusion.  Patient was started on IV antibiotics.  Nephrology and vascular surgery were consulted.  Assessment & Plan:   Principal Problem:   AMS (altered mental status) Active Problems:   ESRD (end stage renal disease) (HCC)   Diabetes (HCC)   Pleural effusion   Atherosclerosis   Elevated troponin   Hyperkalemia   Anemia of chronic disease   Severe malnutrition (HCC)  Right foot gangrene -Vascular surgery evaluation appreciated: Do not recommend arteriography and patient is not a candidate for vascular surgery.  Recommend palliative care versus above-knee amputation. -Currently on broad-spectrum antibiotics. -Overall prognosis is guarded to poor.  I will consult palliative care.  Asked also spoken to Dr. Sharol Given and if family wants aggressive intervention with AKA, this can be done earliest on Friday.  Tried to call patient's son Mallie Mussel on phone but he did not pick up.  Acute metabolic encephalopathy -Patient is probably confused from metabolic issues including right foot gangrene.  CT head was negative for acute abnormality.  Still confused.  Monitor mental status.  Fall precautions  End-stage renal disease on dialysis -Patient apparently has been missing hemodialysis for weeks.  Nephrology  following.  Dialysis as per nephrology schedule.  Apparently, patient had discussed wanting to withdraw dialysis recently  Severe protein calorie malnutrition -Nutrition consult  Abdominal pain -Unclear etiology.  Possible gastritis -Add Protonix  Left pleural effusion  -Unclear etiology.  Ultrasound-guided thoracentesis.  Follow pleural fluid analysis  Hyperkalemia -Continue Lokelma  Elevated troponin -Likely due to demand ischemia versus renal failure -Troponins did not trend up  Diabetes mellitus type 2 with hypoglycemia -Continue sliding scale insulin.  Hemoglobin A1c 6.  Patient has had episodes of hypoglycemia probably due to poor oral intake.  Will start D5 at 50 cc an hour.  Anemia of chronic disease -Hemoglobin stable.  Monitor  Generalized deconditioning -Overall prognosis is very poor.  Tried calling son on phone, he did not pick up.  Palliative care consult.  Consider hospice/comfort measures if no improvement.  DVT prophylaxis: Heparin Code Status: Full Family Communication: None at bedside Disposition Plan: Depends on clinical outcome  Consultants: Vascular/nephrology/orthopedics, Dr. Dennard Nip care  Procedures: None  Antimicrobials: Zosyn and vancomycin from 04/25/2018 onwards   Subjective: Patient seen and examined at bedside.  He is sleepy, hardly wakes up on calling his name.  Does not answer any questions.  No overnight fever or vomiting reported.  Objective: Vitals:   04/25/18 1945 04/25/18 2044 04/26/18 0015 04/26/18 0537  BP:  127/64 114/63 133/63  Pulse: 64 66 63 62  Resp: 17 12    Temp:  97.7 F (36.5 C)  97.6 F (36.4 C)  TempSrc:  Axillary  Oral  SpO2: 100% 99%  98%  Weight: 40.8 kg 40.8 kg      Intake/Output Summary (Last 24 hours) at 04/26/2018 0957 Last data filed at 04/26/2018 0900 Gross per  24 hour  Intake 280.07 ml  Output --  Net 280.07 ml   Filed Weights   04/25/18 1945 04/25/18 2044  Weight: 40.8 kg 40.8 kg     Examination:  General exam: Appears very thinly built.  Looks chronically ill.  Hardly wakes up on calling his name. Respiratory system: Bilateral decreased breath sounds at bases with scattered crackles Cardiovascular system: S1 & S2 heard, Rate controlled Gastrointestinal system: Abdomen is nondistended, soft and nontender. Normal bowel sounds heard. Extremities: No cyanosis, clubbing; right foot necrotic changes  Central nervous system: Strongly drowsy, hardly wakes up on calling his name. No focal neurological deficits. Moving extremities Skin: Gangrenous changes to the toes of the right foot. Psychiatry: Could not be assessed because of mental status.    Data Reviewed: I have personally reviewed following labs and imaging studies  CBC: Recent Labs  Lab 04/25/18 1532 04/25/18 1607 04/26/18 0339  WBC 5.7  --  5.2  NEUTROABS  --   --  3.5  HGB 10.9* 11.9* 9.9*  HCT 35.3* 35.0* 31.7*  MCV 89.6  --  87.8  PLT 117*  --  98*   Basic Metabolic Panel: Recent Labs  Lab 04/25/18 1532 04/25/18 1607 04/26/18 0339  NA 140 139 137  K 5.3* 5.4* 5.2*  CL 96*  --  96*  CO2 20*  --  21*  GLUCOSE 71  --  64*  BUN 92*  --  92*  CREATININE 14.28*  --  13.58*  CALCIUM 8.9  --  8.4*  MG  --   --  2.9*  PHOS  --   --  9.3*   GFR: CrCl cannot be calculated (Unknown ideal weight.). Liver Function Tests: Recent Labs  Lab 04/25/18 1532  AST 16  ALT 8  ALKPHOS 77  BILITOT 0.9  PROT 8.9*  ALBUMIN 3.2*   Recent Labs  Lab 04/25/18 1532  LIPASE 25   No results for input(s): AMMONIA in the last 168 hours. Coagulation Profile: Recent Labs  Lab 04/25/18 1532  INR 1.4*   Cardiac Enzymes: Recent Labs  Lab 04/25/18 1532 04/25/18 1813 04/25/18 1921 04/25/18 2157  TROPONINI 0.10* 0.07* 0.07* 0.06*   BNP (last 3 results) No results for input(s): PROBNP in the last 8760 hours. HbA1C: Recent Labs    04/26/18 0339  HGBA1C 6.0*   CBG: Recent Labs  Lab  04/25/18 1625 04/25/18 2157 04/26/18 0617 04/26/18 0704  GLUCAP 61* 86 44* 140*   Lipid Profile: No results for input(s): CHOL, HDL, LDLCALC, TRIG, CHOLHDL, LDLDIRECT in the last 72 hours. Thyroid Function Tests: Recent Labs    04/26/18 0339  TSH 5.252*   Anemia Panel: No results for input(s): VITAMINB12, FOLATE, FERRITIN, TIBC, IRON, RETICCTPCT in the last 72 hours. Sepsis Labs: Recent Labs  Lab 04/25/18 1532  LATICACIDVEN 1.6    No results found for this or any previous visit (from the past 240 hour(s)).       Radiology Studies: Ct Head Wo Contrast  Result Date: 04/25/2018 CLINICAL DATA:  Altered level of consciousness EXAM: CT HEAD WITHOUT CONTRAST TECHNIQUE: Contiguous axial images were obtained from the base of the skull through the vertex without intravenous contrast. COMPARISON:  None. FINDINGS: Brain: No evidence of acute infarction, hemorrhage, extra-axial collection, ventriculomegaly, or mass effect. Generalized cerebral atrophy. Periventricular white matter low attenuation likely secondary to microangiopathy. Vascular: Cerebrovascular atherosclerotic calcifications are noted. Skull: Negative for fracture or focal lesion. Sinuses/Orbits: Visualized portions of the orbits are unremarkable. Mastoid sinuses  are clear. Right maxillary sinus mucosal thickening. Other: None. IMPRESSION: No acute intracranial pathology. Electronically Signed   By: Kathreen Devoid   On: 04/25/2018 16:59   Ct Angio Aortobifemoral W And/or Wo Contrast  Result Date: 04/25/2018 CLINICAL DATA:  73 year old male with a history of right foot wound and concern for chronic limb threatening ischemia EXAM: CT ANGIOGRAPHY OF CHEST ABDOMINAL AORTA WITH ILIOFEMORAL RUNOFF TECHNIQUE: Multidetector CT imaging of the chest, abdomen, pelvis and lower extremities was performed using the standard protocol during bolus administration of intravenous contrast, following multi detector spiral CT imaging of the chest  without contrast. Multiplanar CT image reconstructions and MIPs were obtained to evaluate the vascular anatomy. CONTRAST:  148mL OMNIPAQUE IOHEXOL 350 MG/ML SOLN COMPARISON:  None. FINDINGS: Chest: Cardiovascular: Heart: Cardiomegaly. No pericardial fluid/thickening. Differential attenuation of the blood pool and the myocardium on the precontrast images. Dense calcifications of left main, left anterior descending, circumflex, right coronary arteries. Aorta: No aneurysm or dissection. Atherosclerotic changes of the thoracic aorta. Pulmonary arteries: No central, lobar, segmental, or proximal subsegmental filling defects. Mediastinum/Nodes: No significant mediastinal adenopathy, with the sensitivity decreased given the patient's body habitus and timing of the contrast bolus. Unremarkable appearance of the thoracic esophagus. Unremarkable thoracic inlet. Lungs/Pleura: Central airways are clear. Right lung clear with no confluent airspace disease, pneumothorax or pleural effusion. Right-sided pleural effusion with near complete atelectasis of the right lower lobe. Apicoposterior segment of the left upper lobe is aerated. Near complete collapse of the lingula. No pneumothorax. Musculoskeletal: No acute displaced fracture. Degenerative changes of the spine. Review of the MIP images confirms the above findings. Abdomen/pelvis: VASCULAR Aorta: Atherosclerotic changes of the abdominal aorta. No aneurysm or dissection. No definite evidence of periaortic fluid. Celiac: Atherosclerotic changes at the celiac artery origin. Branches are patent. There is preferential calcification of distal branches/small vessels of the splenic artery. SMA: SMA patent with mild atherosclerotic changes at the origin. Preferential calcification of the distal branches of the SMA distribution. Renals: Mixed calcified and soft plaque at the origin of the right renal artery with at least 50% stenosis. Dense calcifications of the distal right renal  arterial distribution. On the left there is atherosclerotic changes at the origin of the renal artery. Symmetric to the right there is dense calcifications of the small vessels of the left renal artery distribution. IMA: IMA remains patent with dense calcifications at the origin. Right lower extremity: Advanced atherosclerosis of the right iliac system with no high-grade stenosis or occlusion. No aneurysm or dissection. Hypogastric artery is patent with advanced atherosclerosis of the pelvic arteries. Common femoral artery patent with mild atherosclerotic changes. Profunda femoris patent with patent thigh branches. Dense calcifications. There is circumferential calcifications of nearly the entire native SFA from the origin through the adductor canal and the popliteal artery. Degree of calcification limits the sensitivity for luminal evaluation, however, the SFA appears patent. Beyond the adductor canal, the study is nondiagnostic. There is nearly complete circumferential calcification of all 3 tibial arteries to the ankle yielding the CT a nondiagnostic. Left lower extremity: Advanced atherosclerosis of the left iliac system with no high-grade stenosis or occlusion identified. No aneurysm or dissection. Hypogastric artery patent with advanced atherosclerosis of the pelvic arteries. Common femoral artery with mild atherosclerotic changes. Profunda femoris is patent as well as the thigh branches. Advanced circumferential atherosclerotic calcifications of the thigh branches. There is circumferential calcification of nearly the entire length of the left SFA. The degree of calcification limits the sensitivity for the evaluation  of luminal narrowing, however, the SFA appears patent to the popliteal artery. Beyond the knee, the study is nondiagnostic. There is nearly complete circumferential calcification of all 3 tibial vessels to the ankle, yielding the CTa nondiagnostic. Veins: Unremarkable appearance of the venous  system. Review of the MIP images confirms the above findings. NON-VASCULAR The timing of the contrast bolus limits the evaluation of the abdomen and pelvis given the patient's lean body habitus. Hepatobiliary: Relatively unremarkable liver. Calcified stones within the gallbladder. Pancreas: Pancreas not well evaluated. Spleen: Unremarkable Adrenals/Urinary Tract: Adrenal glands not well visualized. Right: Atrophic right kidney with no hydronephrosis. Left: Atrophic left kidney with no hydronephrosis. Urinary bladder demonstrates calcified stones at the dependent aspect. Circumferential wall thickening. Stomach/Bowel: Relatively unremarkable stomach. Stomach wall not well visualized. No abnormally distended small bowel though the bowel wall not well evaluated. No evidence of obstruction. Appendix is not visualized, however, no inflammatory changes are present adjacent to the cecum to indicate an appendicitis. Colon is decompressed. Colon wall not well evaluated. No significant stool burden or evidence of obstruction. Lymphatic: No significant adenopathy. Mesenteric: No free fluid or air. No adenopathy. Reproductive: Prostatomegaly, with estimated prostate diameter 4.9 cm Other: Cachectic abdominal wall.  No hernia. Musculoskeletal: Advanced degenerative changes of the lumbar spine. Worst degree of degenerative changes at the level of L3-L4 and L4-L5. Vacuum disc phenomenon spans all levels of the lumbar spine. Posterior disc osteophyte of L3-L4 narrows the canal by 50%. No acute displaced fracture. IMPRESSION: No CT evidence of acute aortic syndrome. Multilevel atherosclerosis (diffuse Monckeberg's pattern), including -aortic atherosclerosis with no occlusion or aneurysm. Aortic Atherosclerosis (ICD10-I70.0). -bilateral iliac atherosclerosis with no high-grade stenosis or occlusion -bilateral femoral popliteal atherosclerosis. The degree of circumferential, long segment calcification limits the sensitivity for the  assessment of high-grade stenosis of the femoral popliteal segment, however, the bilateral SFA and popliteal artery appear patent to the knees -advanced bilateral tibial disease. Beyond the knees, the study is essentially nondiagnostic given the circumferential long segment calcification of the tibial arteries, form origin to ankles. Correlation with noninvasive testing is indicated. 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 There may be developing stenosis at the origin of the IMA. 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 Advanced coronary arterial disease. Left-sided pleural effusion with partial collapse of the left upper lobe and left lower lobe. Follow-up CT imaging is indicated once the patient has been treated to assure resolution of the atelectasis and rule out the possibility of persisting nodule/mass. Trace ascites, uncertain origin. The abdomen is not well evaluated given the timing of the contrast bolus and the patient's body habitus. If there is concern for acute abdominal process (other than a vascular process) standard contrast-enhanced CT imaging should be considered. Prostatomegaly, which may account for the circumferential bladder wall thickening. Correlation with any history of bladder outlet obstruction is suggested. There are small calcifications within the lumen of the urinary bladder, potentially retained small stones. Signed, Dulcy Fanny. Dellia Nims, RPVI [facility] Radiology Electronically Signed   By: Corrie Mckusick D.O.   On: 04/25/2018 17:26   Dg Chest Port 1 View  Result Date: 04/25/2018 CLINICAL DATA:  Sudden onset abdominal and back pain during dialysis. EXAM: PORTABLE CHEST 1 VIEW COMPARISON:  None. FINDINGS: Cardiomegaly. The left heart border is obscured. No mediastinal shift. Atherosclerotic calcification of the aortic arch. Normal pulmonary vascularity. Large, loculated left pleural effusion with  underlying atelectasis/consolidation. The right lung is clear. No pneumothorax. No acute osseous  abnormality. IMPRESSION: 1. Large, loculated left pleural effusion with underlying atelectasis/consolidation. 2.  Aortic atherosclerosis (ICD10-I70.0). Electronically Signed   By: Titus Dubin M.D.   On: 04/25/2018 15:59   Ct Angio Chest Aorta W And/or Wo Contrast  Result Date: 04/25/2018 CLINICAL DATA:  73 year old male with a history of right foot wound and concern for chronic limb threatening ischemia EXAM: CT ANGIOGRAPHY OF CHEST ABDOMINAL AORTA WITH ILIOFEMORAL RUNOFF TECHNIQUE: Multidetector CT imaging of the chest, abdomen, pelvis and lower extremities was performed using the standard protocol during bolus administration of intravenous contrast, following multi detector spiral CT imaging of the chest without contrast. Multiplanar CT image reconstructions and MIPs were obtained to evaluate the vascular anatomy. CONTRAST:  123mL OMNIPAQUE IOHEXOL 350 MG/ML SOLN COMPARISON:  None. FINDINGS: Chest: Cardiovascular: Heart: Cardiomegaly. No pericardial fluid/thickening. Differential attenuation of the blood pool and the myocardium on the precontrast images. Dense calcifications of left main, left anterior descending, circumflex, right coronary arteries. Aorta: No aneurysm or dissection. Atherosclerotic changes of the thoracic aorta. Pulmonary arteries: No central, lobar, segmental, or proximal subsegmental filling defects. Mediastinum/Nodes: No significant mediastinal adenopathy, with the sensitivity decreased given the patient's body habitus and timing of the contrast bolus. Unremarkable appearance of the thoracic esophagus. Unremarkable thoracic inlet. Lungs/Pleura: Central airways are clear. Right lung clear with no confluent airspace disease, pneumothorax or pleural effusion. Right-sided pleural effusion with near complete atelectasis of the right lower lobe. Apicoposterior segment of the left upper lobe is  aerated. Near complete collapse of the lingula. No pneumothorax. Musculoskeletal: No acute displaced fracture. Degenerative changes of the spine. Review of the MIP images confirms the above findings. Abdomen/pelvis: VASCULAR Aorta: Atherosclerotic changes of the abdominal aorta. No aneurysm or dissection. No definite evidence of periaortic fluid. Celiac: Atherosclerotic changes at the celiac artery origin. Branches are patent. There is preferential calcification of distal branches/small vessels of the splenic artery. SMA: SMA patent with mild atherosclerotic changes at the origin. Preferential calcification of the distal branches of the SMA distribution. Renals: Mixed calcified and soft plaque at the origin of the right renal artery with at least 50% stenosis. Dense calcifications of the distal right renal arterial distribution. On the left there is atherosclerotic changes at the origin of the renal artery. Symmetric to the right there is dense calcifications of the small vessels of the left renal artery distribution. IMA: IMA remains patent with dense calcifications at the origin. Right lower extremity: Advanced atherosclerosis of the right iliac system with no high-grade stenosis or occlusion. No aneurysm or dissection. Hypogastric artery is patent with advanced atherosclerosis of the pelvic arteries. Common femoral artery patent with mild atherosclerotic changes. Profunda femoris patent with patent thigh branches. Dense calcifications. There is circumferential calcifications of nearly the entire native SFA from the origin through the adductor canal and the popliteal artery. Degree of calcification limits the sensitivity for luminal evaluation, however, the SFA appears patent. Beyond the adductor canal, the study is nondiagnostic. There is nearly complete circumferential calcification of all 3 tibial arteries to the ankle yielding the CT a nondiagnostic. Left lower extremity: Advanced atherosclerosis of the left  iliac system with no high-grade stenosis or occlusion identified. No aneurysm or dissection. Hypogastric artery patent with advanced atherosclerosis of the pelvic arteries. Common femoral artery with mild atherosclerotic changes. Profunda femoris is patent as well as the thigh branches. Advanced circumferential atherosclerotic calcifications of the thigh branches. There is circumferential calcification of nearly the entire length of the left SFA. The degree of calcification limits the  sensitivity for the evaluation of luminal narrowing, however, the SFA appears patent to the popliteal artery. Beyond the knee, the study is nondiagnostic. There is nearly complete circumferential calcification of all 3 tibial vessels to the ankle, yielding the CTa nondiagnostic. Veins: Unremarkable appearance of the venous system. Review of the MIP images confirms the above findings. NON-VASCULAR The timing of the contrast bolus limits the evaluation of the abdomen and pelvis given the patient's lean body habitus. Hepatobiliary: Relatively unremarkable liver. Calcified stones within the gallbladder. Pancreas: Pancreas not well evaluated. Spleen: Unremarkable Adrenals/Urinary Tract: Adrenal glands not well visualized. Right: Atrophic right kidney with no hydronephrosis. Left: Atrophic left kidney with no hydronephrosis. Urinary bladder demonstrates calcified stones at the dependent aspect. Circumferential wall thickening. Stomach/Bowel: Relatively unremarkable stomach. Stomach wall not well visualized. No abnormally distended small bowel though the bowel wall not well evaluated. No evidence of obstruction. Appendix is not visualized, however, no inflammatory changes are present adjacent to the cecum to indicate an appendicitis. Colon is decompressed. Colon wall not well evaluated. No significant stool burden or evidence of obstruction. Lymphatic: No significant adenopathy. Mesenteric: No free fluid or air. No adenopathy. Reproductive:  Prostatomegaly, with estimated prostate diameter 4.9 cm Other: Cachectic abdominal wall.  No hernia. Musculoskeletal: Advanced degenerative changes of the lumbar spine. Worst degree of degenerative changes at the level of L3-L4 and L4-L5. Vacuum disc phenomenon spans all levels of the lumbar spine. Posterior disc osteophyte of L3-L4 narrows the canal by 50%. No acute displaced fracture. IMPRESSION: No CT evidence of acute aortic syndrome. Multilevel atherosclerosis (diffuse Monckeberg's pattern), including -aortic atherosclerosis with no occlusion or aneurysm. Aortic Atherosclerosis (ICD10-I70.0). -bilateral iliac atherosclerosis with no high-grade stenosis or occlusion -bilateral femoral popliteal atherosclerosis. The degree of circumferential, long segment calcification limits the sensitivity for the assessment of high-grade stenosis of the femoral popliteal segment, however, the bilateral SFA and popliteal artery appear patent to the knees -advanced bilateral tibial disease. Beyond the knees, the study is essentially nondiagnostic given the circumferential long segment calcification of the tibial arteries, form origin to ankles. Correlation with noninvasive testing is indicated. 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 There may be developing stenosis at the origin of the IMA. 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 Advanced coronary arterial disease. Left-sided pleural effusion with partial collapse of the left upper lobe and left lower lobe. Follow-up CT imaging is indicated once the patient has been treated to assure resolution of the atelectasis and rule out the possibility of persisting nodule/mass. Trace ascites, uncertain origin. The abdomen is not well evaluated given the timing of the contrast bolus and the patient's body habitus. If there is concern for acute abdominal process (other than a vascular process) standard contrast-enhanced CT imaging should be considered.  Prostatomegaly, which may account for the circumferential bladder wall thickening. Correlation with any history of bladder outlet obstruction is suggested. There are small calcifications within the lumen of the urinary bladder, potentially retained small stones. Signed, Dulcy Fanny. Dellia Nims, RPVI [facility] Radiology Electronically Signed   By: Corrie Mckusick D.O.   On: 04/25/2018 17:26        Scheduled Meds:  acidophilus  1 capsule Oral Daily   atorvastatin  20 mg Oral Daily   Chlorhexidine Gluconate Cloth  6 each Topical Q0600   heparin  5,000 Units Subcutaneous Q8H   insulin aspart  0-9 Units Subcutaneous TID WC   sevelamer carbonate  1,600 mg Oral TID WC   sodium  zirconium cyclosilicate  5 g Oral Once   [START ON 04/27/2018] vancomycin  500 mg Intravenous Q T,Th,Sa-HD   Continuous Infusions:  sodium chloride     sodium chloride     piperacillin-tazobactam (ZOSYN)  IV     vancomycin       LOS: 1 day        Aline August, MD [facility] 04/26/2018, 9:57 AM

## 2018-04-26 NOTE — Progress Notes (Signed)
OT Cancellation Note  Patient Details Name: Ronald Lopez MRN: [id number] DOB: [birth year]   Cancelled Treatment:    Reason Eval/Treat Not Completed: Per PT, pt with limited ability to participate in evaluation, recommended seeing pt at later date. Will follow.  Malka So 04/26/2018, 2:54 PM  Nestor Lewandowsky, OTR/L Acute Rehabilitation Services Pager: [phone] Office: [phone]

## 2018-04-26 NOTE — Progress Notes (Signed)
Patient's arterial access had poor flow and pressures kept bottoming out, patient was re-cannulated and clots were pulled, the venous access then infiltrated. After dressings were applied, ice pack was applied, report was called to Dr Johnney Ou and floor nurse.

## 2018-04-26 NOTE — Progress Notes (Signed)
Initial Nutrition Assessment  DOCUMENTATION CODES:   Not applicable  INTERVENTION:   Recommend placement of Cortrak feeding tube and initation of nutrition support if within goals of care.    Ensure Enlive po BID, each supplement provides 350 kcal and 20 grams of protein  Renal MVI daily  NUTRITION DIAGNOSIS:   Increased nutrient needs related to wound healing as evidenced by estimated needs.  GOAL:   Patient will meet greater than or equal to 90% of their needs  MONITOR:   PO intake, Supplement acceptance, Weight trends, Labs, I & O's, Skin  REASON FOR ASSESSMENT:   Consult Assessment of nutrition requirement/status  ASSESSMENT:   Patient with PMH significant for HTN, HLD, DM, and ESRD on HD. Presents this admission with right foot gangrene and PE.    4/22- left thoracentesis 720 ml drained   RD working remotely.  Per nephrology pt is likely uremic due to noncompliance with HD (three weeks without). Pt expressed stopping treatments all together in the past. PMT to meet with family to discuss need for R AKA and continuing HD.   HD called this morning but pt was in IR for procedure. Plan for treatment today.   Pt with AMS. Spoke with RN via phone. Pt tells nursing he is hungry and will take a couple sips of Ensure. He is refusing all meals. Per RN, pt was not eating much at home either (unable to get details in daily intake). Recommend nutrition support if within goals of care.   Pt's EDW is 43 kg, and he's current under at 40.8 kg. Weight records are limited in history. Unable to obtain height, nursing unsure.   Unable to complete Nutrition-Focused physical exam at this time. Suspect pt is likely severely malnourished given multiple reports of cachexia and weight is below EDW.    Drips: D5 @ 50 ml/hr Medications: SS novolog, renvela, lokelma,  Labs: CBG 44-142 K 5.2 (acceptable HD), creatinine 13.58 Phosphorus 9.3 (H) Mg 2.9 (H)  Diet Order:   Diet Order         Diet renal/carb modified with fluid restriction Diet-HS Snack? Nothing; Fluid restriction: 1200 mL Fluid; Room service appropriate? Yes; Fluid consistency: Thin  Diet effective now              EDUCATION NEEDS:   Not appropriate for education at this time  Skin:  Skin Assessment: Skin Integrity Issues: Skin Integrity Issues:: Diabetic Ulcer Diabetic Ulcer: necrotic right foot  Last BM:  PTA  Height:   Ht Readings from Last 1 Encounters:  No data found for Ht    Weight:   Wt Readings from Last 1 Encounters:  04/25/18 40.8 kg    Ideal Body Weight:     BMI:  There is no height or weight on file to calculate BMI.  Estimated Nutritional Needs:   Kcal:  1600-1800 kcal  Protein:  90-110 grams  Fluid:  1000 ml + UOP   Mariana Single RD, LDN Clinical Nutrition Pager # [phone]

## 2018-04-26 NOTE — Progress Notes (Signed)
Patient ID: Ronald Lopez, male   DOB: [birth year], 73 y.o.   MRN: [id number]  [facility] Progress Note    Subjective:   No events overnight   Objective:   BP 133/63 (BP Location: Right Arm)   Pulse 62   Temp 97.6 F (36.4 C) (Oral)   Resp 12   Wt 40.8 kg   SpO2 98%   Intake/Output: I/O last 3 completed shifts: In: 280.1 [P.O.:30; IV Piggyback:250.1] Out: -    Intake/Output this shift:  No intake/output data recorded. Weight change:   Physical Exam: Gen: cachectic HM in NAD CVS: no rub Resp: cta Abd: benign Ext: gangrenous toes on right foot, left cimino avf +T/B, no edema  Labs: BMET Recent Labs  Lab 04/25/18 1532 04/25/18 1607 04/26/18 0339  NA 140 139 137  K 5.3* 5.4* 5.2*  CL 96*  --  96*  CO2 20*  --  21*  GLUCOSE 71  --  64*  BUN 92*  --  92*  CREATININE 14.28*  --  13.58*  ALBUMIN 3.2*  --   --   CALCIUM 8.9  --  8.4*  PHOS  --   --  9.3*   CBC Recent Labs  Lab 04/25/18 1532 04/25/18 1607 04/26/18 0339  WBC 5.7  --  5.2  NEUTROABS  --   --  3.5  HGB 10.9* 11.9* 9.9*  HCT 35.3* 35.0* 31.7*  MCV 89.6  --  87.8  PLT 117*  --  98*    @IMGRELPRIORS @ Medications:    . acidophilus  1 capsule Oral Daily  . atorvastatin  20 mg Oral Daily  . Chlorhexidine Gluconate Cloth  6 each Topical Q0600  . diphenhydrAMINE  12.5 mg Intravenous Once  . heparin  5,000 Units Subcutaneous Q8H  . insulin aspart  0-9 Units Subcutaneous TID WC  . pantoprazole (PROTONIX) IV  40 mg Intravenous Q24H  . sevelamer carbonate  1,600 mg Oral TID WC  . sodium zirconium cyclosilicate  5 g Oral Once  . [START ON 04/27/2018] vancomycin  500 mg Intravenous Q T,Th,Sa-HD   Dialysis:  [facility], TTS Time 3.5 hrs, edw 43kg, 2K/2.25 Ca, BFR400, DFR A1.5, left cimino AVF Heparin 2,000 units ivp, hectoral 68mcg IVP tid, micera 100 mcg every 2 weeks (last given 03/16/18 due to noncompliance with HD)  Assessment/ Plan:   1. Gangrenous right foot-  CTA with extensive atherosclerosis without significant obstruction.  VVS following and may require RAKA.  Per primary will have Palliative care meet with family and decide about goals/limits of care before proceeding with amputation. 2. ESRD  Continue with HD for now.  Off schedule, for HD today.  Below edw and likely uremic due to noncompliance with HD. 3. Hyperkalemia- plan for HD today.  Due to noncompliance with HD as he had gone almost 3 weeks without HD. 4. 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 Nutrition: renal diet 7. Hypertension: stable 8. Disposition- apparently has had some discussion about stopping dialysis and await Palliative care consult to help set goals/limits of care and proceed based on there discussions with the family and the patient.   Donetta Potts, MD [place] Pager [phone] 04/26/2018, 12:14 PM

## 2018-04-26 NOTE — Evaluation (Signed)
Physical Therapy Evaluation Patient Details Name: Ronald Lopez MRN: [id number] DOB: 03-17-1945 Today's Date: 04/26/2018   History of Present Illness  73 year old male with history of hypertension, hyperlipidemia, diabetes mellitus, end-stage renal disease on hemodialysis presented on 04/25/2018 with abdominal pain and confusion.  He was found to have right foot necrotic changes.  Per chart, may need AKA right LE.  Chest x-ray showed a large, loculated left pleural effusion with underlying atelectasis/consolidation.  Chest CTA and CT angios aortobifemoral showed no PE or dissection, showed extensive atherosclerosis without significant obstruction, and large left pleural effusion.  Patient was started on IV antibiotics.  Clinical Impression  Pt admitted with above diagnosis. Pt currently with functional limitations due to the deficits listed below (see PT Problem List). Pt with AMS and very restless on arrival.  Used interpreter however pt not very responsive to interpreter only relaying that he was itchy and following a few commands.  Pt was assisted to EOB with +2 mod assist and sat EOB 5 min.  Attempt to stand unsuccessful.  Pt positioned in bed comfortably after sitting.  Nurse aware to possibly cover IV site as pt was scratching at it.  CAlled son who did confirm that pt has not walked since 2018 and has been wheelchair transfers with min assist.  He states that he and his mom take care of pt and his mom is strong and can assist pt once home. D/c plan depends on whether pt has surgery or does not.  If he has surgery, d/c plan may change.  Will follow acutely.    Pt will benefit from skilled PT to increase their independence and safety with mobility to allow discharge to the venue listed below.      Follow Up Recommendations Home health PT;Supervision/Assistance - 24 hour(TBA further once plan is made regarding AKA surgery)    Equipment Recommendations  None recommended by PT    Recommendations  for Other Services       Precautions / Restrictions Precautions Precautions: Fall Restrictions Weight Bearing Restrictions: No      Mobility  Bed Mobility Overal bed mobility: Needs Assistance Bed Mobility: Rolling;Sidelying to Sit Rolling: Min guard Sidelying to sit: Mod assist;+2 for physical assistance       General bed mobility comments: Pt thrashing around in bed on arrival, turning side to side.  c/o itching to intepreter which is the only intelligible word pt said per interpreter.  Answered yes no questions inconsistently.  Pt did finally sit up with tactile cues to sit to EOB.  Able to sit up with kyphotic posture for 5 minutes.    Transfers Overall transfer level: Needs assistance Equipment used: 2 person hand held assist Transfers: Sit to/from Stand Sit to Stand: Max assist;+2 physical assistance         General transfer comment: Attempt to stand unsuccessful as pt appeared to have pain in right foot.    Ambulation/Gait                Stairs            Wheelchair Mobility    Modified Rankin (Stroke Patients Only)       Balance Overall balance assessment: Needs assistance Sitting-balance support: No upper extremity supported;Feet supported Sitting balance-Leahy Scale: Fair Sitting balance - Comments: Pt able to sit EOB for 5 min.  Constantly scratching body.  Nurse is aware.  Pertinent Vitals/Pain Pain Assessment: No/denies pain    Home Living Family/patient expects to be discharged to:: Private residence Living Arrangements: Spouse/significant other;Children Available Help at Discharge: Family;Available 24 hours/day Type of Home: Apartment Home Access: Level entry     Home Layout: One level Home Equipment: Wheelchair - manual;Bedside commode      Prior Function Level of Independence: Needs assistance   Gait / Transfers Assistance Needed: squat pivot transfer to wheelchair with  min to mod assist since 2018 per son, wobbles on feet per son  ADL's / Homemaking Assistance Needed: Pt fed himself, needed assist with grooming and bathing, son woudl physically put him in bathtub; pt wore Depends because he didnt like using the 3n1.    Comments: Called son and obtained all information above     Hand Dominance        Extremity/Trunk Assessment   Upper Extremity Assessment Upper Extremity Assessment: Defer to OT evaluation    Lower Extremity Assessment Lower Extremity Assessment: RLE deficits/detail RLE Deficits / Details: Not fully tested as pt cannot follow commands, pain limiting foot movement    Cervical / Trunk Assessment Cervical / Trunk Assessment: Kyphotic  Communication   Communication: Prefers language other than English(Spanish speaking interpreter [id number] - Lizette)  Cognition Arousal/Alertness: Lethargic Behavior During Therapy: Restless;Impulsive Overall Cognitive Status: Impaired/Different from baseline Area of Impairment: Orientation;Memory;Following commands;Safety/judgement;Awareness;Problem solving                 Orientation Level: (difficult to assess as pt not answering interpreter question)     Following Commands: Follows one step commands inconsistently;Follows one step commands with increased time Safety/Judgement: Decreased awareness of safety;Decreased awareness of deficits   Problem Solving: Difficulty sequencing;Requires tactile cues;Requires verbal cues;Decreased initiation General Comments: Difficult to assess due to language barrier and AMS      General Comments General comments (skin integrity, edema, etc.): Attempted to have pt kick LEs however pt kicked a few times only.  Right foot/toe redness/eschar noted    Exercises     Assessment/Plan    PT Assessment Patient needs continued PT services  PT Problem List Decreased activity tolerance;Decreased balance;Decreased strength;Decreased mobility;Decreased  knowledge of use of DME;Decreased safety awareness;Decreased knowledge of precautions;Decreased coordination;Pain;Decreased skin integrity       PT Treatment Interventions DME instruction;Functional mobility training;Therapeutic activities;Therapeutic exercise;Balance training;Patient/family education;Wheelchair mobility training    PT Goals (Current goals can be found in the Care Plan section)  Acute Rehab PT Goals Patient Stated Goal: son wants pt to come home PT Goal Formulation: With patient/family Time For Goal Achievement: 05/10/18 Potential to Achieve Goals: Fair    Frequency Min 3X/week   Barriers to discharge        Co-evaluation               AM-PAC PT "6 Clicks" Mobility  Outcome Measure Help needed turning from your back to your side while in a flat bed without using bedrails?: None Help needed moving from lying on your back to sitting on the side of a flat bed without using bedrails?: A Little Help needed moving to and from a bed to a chair (including a wheelchair)?: A Lot Help needed standing up from a chair using your arms (e.g., wheelchair or bedside chair)?: Total Help needed to walk in hospital room?: Total Help needed climbing 3-5 steps with a railing? : Total 6 Click Score: 12    End of Session Equipment Utilized During Treatment: Gait belt Activity Tolerance: Patient limited by fatigue;Patient limited  by pain Patient left: in bed;with call bell/phone within reach;with bed alarm set Nurse Communication: Mobility status(itching, scratching at IV site - ?cover with kerlix) PT Visit Diagnosis: Unsteadiness on feet (R26.81);Muscle weakness (generalized) (M62.81);Pain Pain - Right/Left: Right Pain - part of body: Ankle and joints of foot    Time: 1131-1158 PT Time Calculation (min) (ACUTE ONLY): 27 min   Charges:   PT Evaluation $PT Eval Moderate Complexity: 1 Mod PT Treatments $Therapeutic Activity: 8-22 mins        Karlei Waldo,PT Acute  Rehabilitation Services Pager:  [phone]  Office:  [place] 04/26/2018, 1:54 PM

## 2018-04-26 NOTE — Progress Notes (Signed)
Report received form ED RN kim.

## 2018-04-26 NOTE — Consult Note (Signed)
Consultation Note Date: 04/26/2018   Patient Name: Ronald Lopez  DOB: 26-Oct-1945  MRN: [id number]  Age / Sex: 73 y.o., male  PCP: Patient, No Pcp Per Referring Physician: Aline August, MD  Reason for Consultation: Establishing goals of care and Psychosocial/spiritual support  HPI/Patient Profile: 73 y.o. male  admitted on 04/25/2018 with medical history significant of HTN, HLD, DM, ESRD on HD, presented to ED for abdominal pain.   ED Course: pt was found to be confused. Denies chest pain, c/o pain but not able to specifically localize pain. Right foot toe necrotic changes noted. Afebrile, no tachycardia, BP 124/92-157/97, O2 sat 92-100% on room air. Labs significant for K 5.3 BUN/Cr 92/14.28, bicarb 20, anion gap 24, VBG pH 7.378, hemoglobin 10.9, troponin 0.1, EKG with diffuse T wave inversion with repeated EKG. CXR showed large, loculated left pleural effusion with underlying atelectasis/consolidation, Chest CTA and Ct Angio Aortobifemoral no PE or dissection, showed extensive atherosclerosis without significant obstruction, and large left pleural effusion. Pt is given vanc and ceftriaxone and admit for further management.   Extensive gangrene of the forefoot of the right foot.  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   Family report continued physical, functional and cognitive decline over the past several years.  Poor p.o. intake with extreme weight loss.  Increased difficulty with compliance with hemodialysis.  Increased difficulty with care needs in the home secondary to viable caregivers, patient's wife has significant health issues of her own.  Family face treatment option decisions, advanced directive decisions and anticipatory care needs.   Clinical Assessment and Goals of Care:  This NP Wadie Lessen reviewed medical records, received report from  team, assessed the patient and then meet at the patient's bedside and spoke by phone with wife, son and daughter along with Spero Geralds the [place] interpreter to discuss diagnosis, prognosis, GOC, EOL wishes disposition and options.  Concept of Hospice and Palliative Care were discussed  A detailed discussion was had today regarding advanced directives.  Concepts specific to code status, artifical feeding and hydration, continued IV antibiotics and rehospitalization was had.  The difference between a aggressive medical intervention path  and a palliative comfort care path for this patient at this time was had.  Values and goals of care important to patient and family were attempted to be elicited.  Natural trajectory and expectations at EOL were discussed.  Questions and concerns addressed.   Family encouraged to call with questions or concerns.      No documented healthcare power of attorney or advanced directive.  The patient's wife and her children worked together and unison for the best interest of the patient.    SUMMARY OF RECOMMENDATIONS    After detailed discussion today by phone all family members are in agreement that the focus of care needs to be on comfort, quality and dignity.  No further life prolonging measures; no further dialysis, diagnostics, IV medications, no artificial feeding or hydration now or in the future, and avoid  rehospitalization's.  Hope is for patient to discharge home with hospice.  Code Status/Advance Care Planning:  DNR/DNI-documented today   Palliative Prophylaxis:   Bowel Regimen, Frequent Pain Assessment, Oral Care and Palliative Wound Care  Additional Recommendations (Limitations, Scope, Preferences): Full comfort  Psycho-social/Spiritual:   Desire for further Chaplaincy support:no  Additional Recommendations: Education on Hospice  Prognosis:   < 2 weeks  Discharge Planning: Home with Hospice      Primary Diagnoses: Present on  Admission: . AMS (altered mental status) . ESRD (end stage renal disease) ([place]) . Pleural effusion . Atherosclerosis . Elevated troponin . Hyperkalemia . Anemia of chronic disease . Severe malnutrition ([place])   I have reviewed the medical record, interviewed the patient and family, and examined the patient. The following aspects are pertinent.  Past Medical History:  Diagnosis Date  . Diabetes mellitus without complication ([place])   . Renal disorder    Social History   Socioeconomic History  . Marital status: Married    Spouse name: Not on file  . Number of children: Not on file  . Years of education: Not on file  . Highest education level: Not on file  Occupational History  . Not on file  Social Needs  . Financial resource strain: Not on file  . Food insecurity:    Worry: Not on file    Inability: Not on file  . Transportation needs:    Medical: Not on file    Non-medical: Not on file  Tobacco Use  . Smoking status: Not on file  Substance and Sexual Activity  . Alcohol use: Not on file  . Drug use: Not on file  . Sexual activity: Not on file  Lifestyle  . Physical activity:    Days per week: Not on file    Minutes per session: Not on file  . Stress: Not on file  Relationships  . Social connections:    Talks on phone: Not on file    Gets together: Not on file    Attends religious service: Not on file    Active member of club or organization: Not on file    Attends meetings of clubs or organizations: Not on file    Relationship status: Not on file  Other Topics Concern  . Not on file  Social History Narrative  . Not on file   History reviewed. No pertinent family history. Scheduled Meds: . acidophilus  1 capsule Oral Daily  . atorvastatin  20 mg Oral Daily  . Chlorhexidine Gluconate Cloth  6 each Topical Q0600  . heparin  5,000 Units Subcutaneous Q8H  . insulin aspart  0-9 Units Subcutaneous TID WC  . pantoprazole (PROTONIX) IV  40 mg Intravenous Q24H   . sevelamer carbonate  1,600 mg Oral TID WC  . sodium zirconium cyclosilicate  5 g Oral Once  . [START ON 04/27/2018] vancomycin  500 mg Intravenous Q T,Th,Sa-HD   Continuous Infusions: . sodium chloride    . sodium chloride    . dextrose 50 mL/hr at 04/26/18 1108  . piperacillin-tazobactam (ZOSYN)  IV    . vancomycin     PRN Meds:.sodium chloride, sodium chloride, acetaminophen **OR** acetaminophen, diphenhydrAMINE, morphine injection, ondansetron **OR** ondansetron (ZOFRAN) IV, traMADol Medications Prior to Admission:  Prior to Admission medications   Medication Sig Start Date End Date Taking? Authorizing Provider  atorvastatin (LIPITOR) 20 MG tablet Take 20 mg by mouth daily.    [provider]  carvedilol (COREG) 3.125 MG tablet Take  3.125 mg by mouth 2 (two) times daily with a meal.    [provider]  ferric citrate (AURYXIA) 1 GM 210 MG(Fe) tablet Take 420 mg by mouth 3 (three) times daily with meals.    [provider]  losartan (COZAAR) 50 MG tablet Take 50 mg by mouth daily.    [provider]  sevelamer carbonate (RENVELA) 800 MG tablet Take 1,600 mg by mouth 3 (three) times daily with meals.    [provider]   No Known Allergies Review of Systems  Unable to perform ROS: Acuity of condition    Physical Exam Constitutional:      General: He is awake.     Appearance: He is cachectic. He is ill-appearing.  Cardiovascular:     Rate and Rhythm: Normal rate and regular rhythm.     Heart sounds: Normal heart sounds.  Pulmonary:     Breath sounds: Decreased breath sounds present.  Musculoskeletal:     Comments: -Changes to foot secondary to gangrene  Skin:    General: Skin is warm and dry.     Vital Signs: BP 133/63 (BP Location: Right Arm)   Pulse 62   Temp 97.6 F (36.4 C) (Oral)   Resp 12   Wt 40.8 kg   SpO2 98%  Pain Scale: Faces       SpO2: SpO2: 98 % O2 Device:SpO2: 98 % O2 Flow Rate: .   IO:  Intake/output summary:   Intake/Output Summary (Last 24 hours) at 04/26/2018 1502 Last data filed at 04/26/2018 1412 Gross per 24 hour  Intake 380.07 ml  Output -  Net 380.07 ml    LBM:   Baseline Weight: Weight: 40.8 kg Most recent weight: Weight: 40.8 kg     Palliative Assessment/Data: 30 % at best   Discussed with Dr Starla Link and bedside RN  Time In: 0930 Time Out: 3545 Time Total: 75 minutes Greater than 50%  of this time was spent counseling and coordinating care related to the above assessment and plan.  Signed by: Wadie Lessen, NP   Please contact Palliative Medicine Team phone at [phone] for questions and concerns.  For individual provider: See Shea Evans

## 2018-04-26 NOTE — Progress Notes (Signed)
Pt moved to 4E-17 to be closer to nurses station for safety. Pt became very agitated and bit IV tubing off. Family notified. Pt calmed, warm blankets provided. Pt given and fed meal. Pt resting comfortably. Will continue to monitor.  Amanda Cockayne, RN

## 2018-04-26 NOTE — Progress Notes (Signed)
Hypoglycemic Event  CBG: 44  Treatment: D50 50 mL (25 gm)  Symptoms: None  Follow-up CBG: Time:0700 CBG Result:140  Possible Reasons for Event: Inadequate meal intake  Comments/MD notified:DR. Alekh text paged.     Ronald Lopez

## 2018-04-26 NOTE — Progress Notes (Signed)
Text paged on call tried because pt was still not resting after Iv morphine and Benadryl, asked if pt could have some Ativan. Received order for 1 time ativan IV and Prn morphine iv for pain. Will continue to monitor.

## 2018-04-26 NOTE — Progress Notes (Signed)
Pt now relaxed and resting after receving Ativan. Mittens removed, pt hated it. Will continue to monitor.

## 2018-04-26 NOTE — Progress Notes (Signed)
Pt received form ED, pt was confused, irritable, crying, itching all over and trying to crawl out of bed. Pt is spanish speaking, did not speak [place]. [place] NT helped with translation. PT Ao x2. Didn't know he was at the hospital. We struggled to give CHG bath, pt was not cooperating. Connected to tele, CCMD notified. Pt was itching and pulling wires. Mittens applied. Pt stated he has no appetite, and has diarrhea often which is his normal. 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 Will continue to monitor.

## 2018-04-26 NOTE — Progress Notes (Signed)
Called Pt's Son Maki Sweetser to verify some pt information. Per him "patient is not allergic to any medication. He has not been eating well since past couple weeks and his health has been detoriating. Pt had missed 3 weeks of dialysis. 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

## 2018-04-26 NOTE — Progress Notes (Addendum)
[id number]: paged on call [facility].  [id number]: Lamar Blinks NP from [facility] called back. Notified  pt is itching all over, fighting everything and trying to get OOB.  received order for IV morphine and Benadryl. She said to call back if ut doesn't help patient.

## 2018-04-26 NOTE — Progress Notes (Signed)
Pt asked for some chocolate milk, Chocolate Glucerna given. Pt was able to swollow. HE took 3 sips and refused. Unable to give his Lokelma , pt would not open his mouth to drink anything. Will continue to monitor.

## 2018-04-26 NOTE — Procedures (Signed)
Ultrasound-guided diagnostic and therapeutic left thoracentesis performed yielding 720 cc of slightly hazy, amber fluid. No immediate complications. Follow-up chest x-ray pending. A portion of the fluid was sent to the lab for preordered studies. EBL none.

## 2018-04-27 DIAGNOSIS — Z515 Encounter for palliative care: Secondary | ICD-10-CM

## 2018-04-27 DIAGNOSIS — I998 Other disorder of circulatory system: Secondary | ICD-10-CM

## 2018-04-27 DIAGNOSIS — Z66 Do not resuscitate: Secondary | ICD-10-CM

## 2018-04-27 LAB — CBC WITH DIFFERENTIAL/PLATELET
Abs Immature Granulocytes: 0.03 10*3/uL (ref 0.00–0.07)
Basophils Absolute: 0.1 10*3/uL (ref 0.0–0.1)
Basophils Relative: 1 %
Eosinophils Absolute: 0.4 10*3/uL (ref 0.0–0.5)
Eosinophils Relative: 8 %
HCT: 29.4 % — ABNORMAL LOW (ref 39.0–52.0)
Hemoglobin: 9.1 g/dL — ABNORMAL LOW (ref 13.0–17.0)
Immature Granulocytes: 1 %
Lymphocytes Relative: 12 %
Lymphs Abs: 0.6 10*3/uL — ABNORMAL LOW (ref 0.7–4.0)
MCH: 27.4 pg (ref 26.0–34.0)
MCHC: 31 g/dL (ref 30.0–36.0)
MCV: 88.6 fL (ref 80.0–100.0)
Monocytes Absolute: 0.6 10*3/uL (ref 0.1–1.0)
Monocytes Relative: 12 %
Neutro Abs: 3.2 10*3/uL (ref 1.7–7.7)
Neutrophils Relative %: 66 %
Platelets: 74 10*3/uL — ABNORMAL LOW (ref 150–400)
RBC: 3.32 MIL/uL — ABNORMAL LOW (ref 4.22–5.81)
RDW: 20.5 % — ABNORMAL HIGH (ref 11.5–15.5)
WBC: 4.8 10*3/uL (ref 4.0–10.5)
nRBC: 0 % (ref 0.0–0.2)

## 2018-04-27 LAB — TSH: TSH: 6.356 u[IU]/mL — ABNORMAL HIGH (ref 0.350–4.500)

## 2018-04-27 LAB — COMPREHENSIVE METABOLIC PANEL
ALT: 10 U/L (ref 0–44)
AST: 19 U/L (ref 15–41)
Albumin: 2.6 g/dL — ABNORMAL LOW (ref 3.5–5.0)
Alkaline Phosphatase: 68 U/L (ref 38–126)
Anion gap: 19 — ABNORMAL HIGH (ref 5–15)
BUN: 85 mg/dL — ABNORMAL HIGH (ref 8–23)
CO2: 19 mmol/L — ABNORMAL LOW (ref 22–32)
Calcium: 8.7 mg/dL — ABNORMAL LOW (ref 8.9–10.3)
Chloride: 98 mmol/L (ref 98–111)
Creatinine, Ser: 12.79 mg/dL — ABNORMAL HIGH (ref 0.61–1.24)
GFR calc Af Amer: 4 mL/min — ABNORMAL LOW (ref >60)
GFR calc non Af Amer: 3 mL/min — ABNORMAL LOW (ref >60)
Glucose, Bld: 68 mg/dL — ABNORMAL LOW (ref 70–99)
Potassium: 5.4 mmol/L — ABNORMAL HIGH (ref 3.5–5.1)
Sodium: 136 mmol/L (ref 135–145)
Total Bilirubin: 0.8 mg/dL (ref 0.3–1.2)
Total Protein: 7.4 g/dL (ref 6.5–8.1)

## 2018-04-27 LAB — GLUCOSE, CAPILLARY
Glucose-Capillary: 68 mg/dL — ABNORMAL LOW (ref 70–99)
Glucose-Capillary: 107 mg/dL — ABNORMAL HIGH (ref 70–99)

## 2018-04-27 LAB — MAGNESIUM: Magnesium: 2.7 mg/dL — ABNORMAL HIGH (ref 1.7–2.4)

## 2018-04-27 LAB — CBC
HCT: 29.3 % — ABNORMAL LOW (ref 39.0–52.0)
Hemoglobin: 9.2 g/dL — ABNORMAL LOW (ref 13.0–17.0)
MCH: 27.8 pg (ref 26.0–34.0)
MCHC: 31.4 g/dL (ref 30.0–36.0)
MCV: 88.5 fL (ref 80.0–100.0)
Platelets: 77 10*3/uL — ABNORMAL LOW (ref 150–400)
RBC: 3.31 MIL/uL — ABNORMAL LOW (ref 4.22–5.81)
RDW: 20.2 % — ABNORMAL HIGH (ref 11.5–15.5)
WBC: 5.2 10*3/uL (ref 4.0–10.5)
nRBC: 0 % (ref 0.0–0.2)

## 2018-04-27 LAB — ACID FAST SMEAR (AFB, MYCOBACTERIA): Acid Fast Smear: NEGATIVE

## 2018-04-27 LAB — PH, BODY FLUID: pH, Body Fluid: 7.5

## 2018-04-27 LAB — FOLATE: Folate: 25.1 ng/mL (ref >5.9)

## 2018-04-27 LAB — AMMONIA: Ammonia: 41 umol/L — ABNORMAL HIGH (ref 9–35)

## 2018-04-27 LAB — VITAMIN B12: Vitamin B-12: 1227 pg/mL — ABNORMAL HIGH (ref 180–914)

## 2018-04-27 MED ORDER — HALOPERIDOL LACTATE 5 MG/ML IJ SOLN
2.0000 mg | Freq: Once | INTRAMUSCULAR | Status: AC
  Administered 2018-04-27: 15:00:00 2 mg via INTRAMUSCULAR

## 2018-04-27 MED ORDER — DEXTROSE 10 % IV SOLN
INTRAVENOUS | Status: DC
  Administered 2018-04-27: 10:00:00 via INTRAVENOUS

## 2018-04-27 MED ORDER — MORPHINE SULFATE (CONCENTRATE) 10 MG/0.5ML PO SOLN
5.0000 mg | ORAL | Status: DC | PRN
  Administered 2018-04-27: 14:00:00 5 mg via ORAL
  Filled 2018-04-27: qty 0.5

## 2018-04-27 MED ORDER — CHLORHEXIDINE GLUCONATE CLOTH 2 % EX PADS: 6.0000 | MEDICATED_PAD | Freq: Every day | CUTANEOUS | Status: DC

## 2018-04-27 MED ORDER — LACTULOSE 10 GM/15ML PO SOLN
10.0000 g | Freq: Every day | ORAL | Status: DC
  Filled 2018-04-27: qty 15

## 2018-04-27 MED ORDER — HALOPERIDOL 0.5 MG PO TABS: 0.5000 mg | ORAL_TABLET | Freq: Four times a day (QID) | ORAL | 0 refills | Status: AC | PRN

## 2018-04-27 MED ORDER — LORAZEPAM 1 MG PO TABS: 1.0000 mg | ORAL_TABLET | ORAL | Status: DC | PRN

## 2018-04-27 MED ORDER — HALOPERIDOL LACTATE 5 MG/ML IJ SOLN
2.5000 mg | Freq: Once | INTRAMUSCULAR | Status: DC
  Filled 2018-04-27: qty 1

## 2018-04-27 MED ORDER — MORPHINE SULFATE (CONCENTRATE) 10 MG/0.5ML PO SOLN: 5.0000 mg | ORAL | 0 refills | Status: AC | PRN

## 2018-04-27 MED ORDER — DIPHENHYDRAMINE HCL 25 MG PO CAPS: 25.0000 mg | ORAL_CAPSULE | Freq: Three times a day (TID) | ORAL | 0 refills | Status: AC | PRN

## 2018-04-27 MED ORDER — HALOPERIDOL LACTATE 5 MG/ML IJ SOLN: 1.0000 mg | Freq: Four times a day (QID) | INTRAMUSCULAR | Status: DC | PRN

## 2018-04-27 MED ORDER — DEXTROSE 50 % IV SOLN
INTRAVENOUS | Status: AC
  Administered 2018-04-27: 04:00:00 25 mL
  Filled 2018-04-27: qty 50

## 2018-04-27 MED ORDER — LORAZEPAM 1 MG PO TABS: 1.0000 mg | ORAL_TABLET | ORAL | 0 refills | Status: AC | PRN

## 2018-04-27 NOTE — Discharge Summary (Signed)
Physician Discharge Summary  Ronald Lopez GXQ:[id number] DOB: Nov 03, 1945 DOA: 04/25/2018  PCP: Patient, No Pcp Per  Admit date: 04/25/2018 Discharge date: 04/27/2018  Admitted From: Home Disposition: Home with hospice  Recommendations for Outpatient Follow-up:  1. Follow up with home hospice at earliest [place]: Home hospice Equipment/Devices: None  Discharge Condition: Poor CODE STATUS: DNR  Diet recommendation: As per comfort measures  Brief/Interim Summary: 73 year old male with history of hypertension, hyperlipidemia, diabetes mellitus, end-stage renal disease on hemodialysis presented on 04/25/2018 with abdominal pain and confusion.  He was found to have right foot necrotic changes.  Chest x-ray showed a large, loculated left pleural effusion with underlying atelectasis/consolidation.  Chest CTA and CT angios aortobifemoral showed no PE or dissection, showed extensive atherosclerosis without significant obstruction, and large left pleural effusion.  Patient was started on IV antibiotics.  Nephrology and vascular surgery were consulted. Patient was deemed a very poor candidate for any vascular surgery intervention.  Palliative care was consulted.  After discussion with family members, it was decided that patient will be made comfort measures and hemodialysis will be stopped. He will be discharged home with hospice.   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  Vascular surgery deemed that patient is not a candidate for any vascular surgery intervention and recommended palliative care versus AKA.  Because of overall very poor prognosis and worsening general condition, after discussion of palliative care team with family members, family has decided to make patient comfort measures.  They would like to start dialysis.  They would like to bring patient home with home hospice.  He will be discharged home with hospice once arrangements have been made.  Discharge Instructions  Discharge Instructions    Diet - low sodium heart healthy   Complete by:  As directed    Increase activity slowly   Complete by:  As directed      Allergies as of 04/27/2018   No Known Allergies     Medication List    STOP taking these medications   atorvastatin 20 MG tablet Commonly known as:  LIPITOR   Auryxia 1 GM 210 MG(Fe) tablet Generic drug:  ferric citrate   carvedilol 3.125 MG tablet Commonly known as:  COREG   losartan 50 MG tablet Commonly known as:  COZAAR   sevelamer carbonate 800 MG tablet Commonly known as:  RENVELA     TAKE these medications   diphenhydrAMINE 25 mg capsule Commonly known as:  BENADRYL Take 1 capsule (25 mg total) by mouth every 8 (eight) hours as needed for itching or allergies.   haloperidol 0.5 MG tablet Commonly known as:  HALDOL Take 1 tablet (0.5 mg total) by mouth every 6 (six) hours as needed for up to 5 days for agitation.   LORazepam 1 MG tablet Commonly known as:  ATIVAN Take 1 tablet (1 mg total) by mouth every 4 (four) hours as  needed for anxiety.   morphine CONCENTRATE 10 MG/0.5ML Soln concentrated solution Take 0.25 mLs (5 mg total) by mouth every hour as needed for moderate pain or shortness of breath.      Follow-up Information     Home hospice Follow up today.   Why:  at earliest convenience         No Known Allergies  Consultations:  Vascular surgery/nephrology/palliative care/orthopedics, Dr. Sharol Given   Procedures/Studies: Dg Chest 1 View  Result Date: 04/26/2018 CLINICAL DATA:  Status post left thoracentesis. EXAM: CHEST  1 VIEW COMPARISON:  Radiograph of April 25, 2018. FINDINGS: Stable cardiomegaly. Atherosclerosis of thoracic aorta is noted. Grossly stable left pleural effusion is noted with associated atelectasis or infiltrate. No pneumothorax is noted. Right lung is hyperexpanded, but otherwise normal. Bony thorax unremarkable. IMPRESSION: No pneumothorax status post left thoracentesis. Aortic Atherosclerosis (ICD10-I70.0). Electronically Signed   By: Marijo Conception M.D.   On: 04/26/2018 15:44   Ct Head Wo Contrast  Result Date: 04/25/2018 CLINICAL DATA:  Altered level of consciousness EXAM: CT HEAD WITHOUT CONTRAST TECHNIQUE: Contiguous axial images were obtained from the base of the skull through the vertex without intravenous contrast. COMPARISON:  None. FINDINGS: Brain: No evidence of acute infarction, hemorrhage, extra-axial collection, ventriculomegaly, or mass effect. Generalized cerebral atrophy. Periventricular white matter low attenuation likely secondary to microangiopathy. Vascular: Cerebrovascular atherosclerotic calcifications are noted. Skull: Negative for fracture or focal lesion. Sinuses/Orbits: Visualized portions of the orbits are unremarkable. Mastoid sinuses are clear. Right maxillary sinus mucosal thickening. Other: None. IMPRESSION: No acute intracranial pathology. Electronically Signed   By: Kathreen Devoid   On: 04/25/2018 16:59   Ct Angio Aortobifemoral W And/or Wo Contrast  Result Date: 04/25/2018 CLINICAL DATA:  73 year old male with a history of right foot wound and concern for chronic limb threatening ischemia EXAM: CT ANGIOGRAPHY OF CHEST ABDOMINAL AORTA WITH ILIOFEMORAL RUNOFF  TECHNIQUE: Multidetector CT imaging of the chest, abdomen, pelvis and lower extremities was performed using the standard protocol during bolus administration of intravenous contrast, following multi detector spiral CT imaging of the chest without contrast. Multiplanar CT image reconstructions and MIPs were obtained to evaluate the vascular anatomy. CONTRAST:  127mL OMNIPAQUE IOHEXOL 350 MG/ML SOLN COMPARISON:  None. FINDINGS: Chest: Cardiovascular: Heart: Cardiomegaly. No pericardial fluid/thickening. Differential attenuation of the blood pool and the myocardium on the precontrast images. Dense calcifications of left main, left anterior descending, circumflex, right coronary arteries. Aorta: No aneurysm or dissection. Atherosclerotic changes of the thoracic aorta. Pulmonary arteries: No central, lobar, segmental, or proximal subsegmental filling defects. Mediastinum/Nodes: No significant mediastinal adenopathy, with the sensitivity decreased given the patient's body habitus and timing of the contrast bolus. Unremarkable appearance of the thoracic esophagus. Unremarkable thoracic inlet. Lungs/Pleura: Central airways are clear. Right lung clear with no confluent airspace disease, pneumothorax or pleural effusion. Right-sided pleural effusion with near complete atelectasis of the right lower lobe. Apicoposterior segment of the left upper lobe is aerated. Near complete collapse of the lingula. No pneumothorax. Musculoskeletal: No acute displaced fracture. Degenerative changes of the spine. Review of the MIP images confirms the above findings. Abdomen/pelvis: VASCULAR Aorta: Atherosclerotic changes of the abdominal aorta. No aneurysm or dissection. No definite evidence of periaortic fluid. Celiac: Atherosclerotic changes at the celiac artery origin. Branches are patent. There is preferential calcification of distal branches/small vessels of the splenic artery. SMA: SMA patent with mild atherosclerotic changes at the  origin. Preferential calcification of the distal branches of the SMA distribution. Renals: Mixed calcified and  soft plaque at the origin of the right renal artery with at least 50% stenosis. Dense calcifications of the distal right renal arterial distribution. On the left there is atherosclerotic changes at the origin of the renal artery. Symmetric to the right there is dense calcifications of the small vessels of the left renal artery distribution. IMA: IMA remains patent with dense calcifications at the origin. Right lower extremity: Advanced atherosclerosis of the right iliac system with no high-grade stenosis or occlusion. No aneurysm or dissection. Hypogastric artery is patent with advanced atherosclerosis of the pelvic arteries. Common femoral artery patent with mild atherosclerotic changes. Profunda femoris patent with patent thigh branches. Dense calcifications. There is circumferential calcifications of nearly the entire native SFA from the origin through the adductor canal and the popliteal artery. Degree of calcification limits the sensitivity for luminal evaluation, however, the SFA appears patent. Beyond the adductor canal, the study is nondiagnostic. There is nearly complete circumferential calcification of all 3 tibial arteries to the ankle yielding the CT a nondiagnostic. Left lower extremity: Advanced atherosclerosis of the left iliac system with no high-grade stenosis or occlusion identified. No aneurysm or dissection. Hypogastric artery patent with advanced atherosclerosis of the pelvic arteries. Common femoral artery with mild atherosclerotic changes. Profunda femoris is patent as well as the thigh branches. Advanced circumferential atherosclerotic calcifications of the thigh branches. There is circumferential calcification of nearly the entire length of the left SFA. The degree of calcification limits the sensitivity for the evaluation of luminal narrowing, however, the SFA appears patent to the  popliteal artery. Beyond the knee, the study is nondiagnostic. There is nearly complete circumferential calcification of all 3 tibial vessels to the ankle, yielding the CTa nondiagnostic. Veins: Unremarkable appearance of the venous system. Review of the MIP images confirms the above findings. NON-VASCULAR The timing of the contrast bolus limits the evaluation of the abdomen and pelvis given the patient's lean body habitus. Hepatobiliary: Relatively unremarkable liver. Calcified stones within the gallbladder. Pancreas: Pancreas not well evaluated. Spleen: Unremarkable Adrenals/Urinary Tract: Adrenal glands not well visualized. Right: Atrophic right kidney with no hydronephrosis. Left: Atrophic left kidney with no hydronephrosis. Urinary bladder demonstrates calcified stones at the dependent aspect. Circumferential wall thickening. Stomach/Bowel: Relatively unremarkable stomach. Stomach wall not well visualized. No abnormally distended small bowel though the bowel wall not well evaluated. No evidence of obstruction. Appendix is not visualized, however, no inflammatory changes are present adjacent to the cecum to indicate an appendicitis. Colon is decompressed. Colon wall not well evaluated. No significant stool burden or evidence of obstruction. Lymphatic: No significant adenopathy. Mesenteric: No free fluid or air. No adenopathy. Reproductive: Prostatomegaly, with estimated prostate diameter 4.9 cm Other: Cachectic abdominal wall.  No hernia. Musculoskeletal: Advanced degenerative changes of the lumbar spine. Worst degree of degenerative changes at the level of L3-L4 and L4-L5. Vacuum disc phenomenon spans all levels of the lumbar spine. Posterior disc osteophyte of L3-L4 narrows the canal by 50%. No acute displaced fracture. IMPRESSION: No CT evidence of acute aortic syndrome. Multilevel atherosclerosis (diffuse Monckeberg's pattern), including -aortic atherosclerosis with no occlusion or aneurysm. Aortic  Atherosclerosis (ICD10-I70.0). -bilateral iliac atherosclerosis with no high-grade stenosis or occlusion -bilateral femoral popliteal atherosclerosis. The degree of circumferential, long segment calcification limits the sensitivity for the assessment of high-grade stenosis of the femoral popliteal segment, however, the bilateral SFA and popliteal artery appear patent to the knees -advanced bilateral tibial disease. Beyond the knees, the study is essentially nondiagnostic given the circumferential long segment calcification of the  tibial arteries, form origin to ankles. Correlation with noninvasive testing is indicated. 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 There may be developing stenosis at the origin of the IMA. 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 Advanced coronary arterial disease. Left-sided pleural effusion with partial collapse of the left upper lobe and left lower lobe. Follow-up CT imaging is indicated once the patient has been treated to assure resolution of the atelectasis and rule out the possibility of persisting nodule/mass. Trace ascites, uncertain origin. The abdomen is not well evaluated given the timing of the contrast bolus and the patient's body habitus. If there is concern for acute abdominal process (other than a vascular process) standard contrast-enhanced CT imaging should be considered. Prostatomegaly, which may account for the circumferential bladder wall thickening. Correlation with any history of bladder outlet obstruction is suggested. There are small calcifications within the lumen of the urinary bladder, potentially retained small stones. Signed, Dulcy Fanny. Dellia Nims, RPVI [facility] Radiology Electronically Signed   By: Corrie Mckusick D.O.   On: 04/25/2018 17:26   Dg Chest Port 1 View  Result Date: 04/25/2018 CLINICAL DATA:  Sudden onset abdominal and back pain during dialysis. EXAM: PORTABLE  CHEST 1 VIEW COMPARISON:  None. FINDINGS: Cardiomegaly. The left heart border is obscured. No mediastinal shift. Atherosclerotic calcification of the aortic arch. Normal pulmonary vascularity. Large, loculated left pleural effusion with underlying atelectasis/consolidation. The right lung is clear. No pneumothorax. No acute osseous abnormality. IMPRESSION: 1. Large, loculated left pleural effusion with underlying atelectasis/consolidation. 2.  Aortic atherosclerosis (ICD10-I70.0). Electronically Signed   By: Titus Dubin M.D.   On: 04/25/2018 15:59   Dg Foot Complete Right  Result Date: 04/01/2018 CLINICAL DATA:  Bleeding toes. EXAM: RIGHT FOOT COMPLETE - 3+ VIEW COMPARISON:  None. FINDINGS: Severe bony demineralization. Extensive vascular calcifications. Deformity of the proximal phalanx of the second toe probably from an old fracture which has healed, correlate with any findings of the second toe on physical exam. No appreciable bony destructive findings to suggest active osteomyelitis. IMPRESSION: 1. No bony destructive findings characteristic of osteomyelitis. No gas tracking in the soft tissues. 2. Deformity of the proximal phalanx second toe, probably due to an old fracture, correlate with any physical exam findings of the second toe. 3. Markedly severe bony demineralization. 4. Markedly severe atherosclerotic vascular calcifications in the foot. Electronically Signed   By: Van Clines M.D.   On: 04/01/2018 19:15   Ct Angio Chest Aorta W And/or Wo Contrast  Result Date: 04/25/2018 CLINICAL DATA:  73 year old male with a history of right foot wound and concern for chronic limb threatening ischemia EXAM: CT ANGIOGRAPHY OF CHEST ABDOMINAL AORTA WITH ILIOFEMORAL RUNOFF TECHNIQUE: Multidetector CT imaging of the chest, abdomen, pelvis and lower extremities was performed using the standard protocol during bolus administration of intravenous contrast, following multi detector spiral CT imaging of  the chest without contrast. Multiplanar CT image reconstructions and MIPs were obtained to evaluate the vascular anatomy. CONTRAST:  142mL OMNIPAQUE IOHEXOL 350 MG/ML SOLN COMPARISON:  None. FINDINGS: Chest: Cardiovascular: Heart: Cardiomegaly. No pericardial fluid/thickening. Differential attenuation of the blood pool and the myocardium on the precontrast images. Dense calcifications of left main, left anterior descending, circumflex, right coronary arteries. Aorta: No aneurysm or dissection. Atherosclerotic changes of the thoracic aorta. Pulmonary arteries: No central, lobar, segmental, or proximal subsegmental filling defects. Mediastinum/Nodes: No significant mediastinal adenopathy, with the sensitivity decreased given the patient's body habitus and timing of the contrast bolus.  Unremarkable appearance of the thoracic esophagus. Unremarkable thoracic inlet. Lungs/Pleura: Central airways are clear. Right lung clear with no confluent airspace disease, pneumothorax or pleural effusion. Right-sided pleural effusion with near complete atelectasis of the right lower lobe. Apicoposterior segment of the left upper lobe is aerated. Near complete collapse of the lingula. No pneumothorax. Musculoskeletal: No acute displaced fracture. Degenerative changes of the spine. Review of the MIP images confirms the above findings. Abdomen/pelvis: VASCULAR Aorta: Atherosclerotic changes of the abdominal aorta. No aneurysm or dissection. No definite evidence of periaortic fluid. Celiac: Atherosclerotic changes at the celiac artery origin. Branches are patent. There is preferential calcification of distal branches/small vessels of the splenic artery. SMA: SMA patent with mild atherosclerotic changes at the origin. Preferential calcification of the distal branches of the SMA distribution. Renals: Mixed calcified and soft plaque at the origin of the right renal artery with at least 50% stenosis. Dense calcifications of the distal right  renal arterial distribution. On the left there is atherosclerotic changes at the origin of the renal artery. Symmetric to the right there is dense calcifications of the small vessels of the left renal artery distribution. IMA: IMA remains patent with dense calcifications at the origin. Right lower extremity: Advanced atherosclerosis of the right iliac system with no high-grade stenosis or occlusion. No aneurysm or dissection. Hypogastric artery is patent with advanced atherosclerosis of the pelvic arteries. Common femoral artery patent with mild atherosclerotic changes. Profunda femoris patent with patent thigh branches. Dense calcifications. There is circumferential calcifications of nearly the entire native SFA from the origin through the adductor canal and the popliteal artery. Degree of calcification limits the sensitivity for luminal evaluation, however, the SFA appears patent. Beyond the adductor canal, the study is nondiagnostic. There is nearly complete circumferential calcification of all 3 tibial arteries to the ankle yielding the CT a nondiagnostic. Left lower extremity: Advanced atherosclerosis of the left iliac system with no high-grade stenosis or occlusion identified. No aneurysm or dissection. Hypogastric artery patent with advanced atherosclerosis of the pelvic arteries. Common femoral artery with mild atherosclerotic changes. Profunda femoris is patent as well as the thigh branches. Advanced circumferential atherosclerotic calcifications of the thigh branches. There is circumferential calcification of nearly the entire length of the left SFA. The degree of calcification limits the sensitivity for the evaluation of luminal narrowing, however, the SFA appears patent to the popliteal artery. Beyond the knee, the study is nondiagnostic. There is nearly complete circumferential calcification of all 3 tibial vessels to the ankle, yielding the CTa nondiagnostic. Veins: Unremarkable appearance of the  venous system. Review of the MIP images confirms the above findings. NON-VASCULAR The timing of the contrast bolus limits the evaluation of the abdomen and pelvis given the patient's lean body habitus. Hepatobiliary: Relatively unremarkable liver. Calcified stones within the gallbladder. Pancreas: Pancreas not well evaluated. Spleen: Unremarkable Adrenals/Urinary Tract: Adrenal glands not well visualized. Right: Atrophic right kidney with no hydronephrosis. Left: Atrophic left kidney with no hydronephrosis. Urinary bladder demonstrates calcified stones at the dependent aspect. Circumferential wall thickening. Stomach/Bowel: Relatively unremarkable stomach. Stomach wall not well visualized. No abnormally distended small bowel though the bowel wall not well evaluated. No evidence of obstruction. Appendix is not visualized, however, no inflammatory changes are present adjacent to the cecum to indicate an appendicitis. Colon is decompressed. Colon wall not well evaluated. No significant stool burden or evidence of obstruction. Lymphatic: No significant adenopathy. Mesenteric: No free fluid or air. No adenopathy. Reproductive: Prostatomegaly, with estimated prostate diameter 4.9 cm Other:  Cachectic abdominal wall.  No hernia. Musculoskeletal: Advanced degenerative changes of the lumbar spine. Worst degree of degenerative changes at the level of L3-L4 and L4-L5. Vacuum disc phenomenon spans all levels of the lumbar spine. Posterior disc osteophyte of L3-L4 narrows the canal by 50%. No acute displaced fracture. IMPRESSION: No CT evidence of acute aortic syndrome. Multilevel atherosclerosis (diffuse Monckeberg's pattern), including -aortic atherosclerosis with no occlusion or aneurysm. Aortic Atherosclerosis (ICD10-I70.0). -bilateral iliac atherosclerosis with no high-grade stenosis or occlusion -bilateral femoral popliteal atherosclerosis. The degree of circumferential, long segment calcification limits the sensitivity for  the assessment of high-grade stenosis of the femoral popliteal segment, however, the bilateral SFA and popliteal artery appear patent to the knees -advanced bilateral tibial disease. Beyond the knees, the study is essentially nondiagnostic given the circumferential long segment calcification of the tibial arteries, form origin to ankles. Correlation with noninvasive testing is indicated. 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 There may be developing stenosis at the origin of the IMA. 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 Advanced coronary arterial disease. Left-sided pleural effusion with partial collapse of the left upper lobe and left lower lobe. Follow-up CT imaging is indicated once the patient has been treated to assure resolution of the atelectasis and rule out the possibility of persisting nodule/mass. Trace ascites, uncertain origin. The abdomen is not well evaluated given the timing of the contrast bolus and the patient's body habitus. If there is concern for acute abdominal process (other than a vascular process) standard contrast-enhanced CT imaging should be considered. Prostatomegaly, which may account for the circumferential bladder wall thickening. Correlation with any history of bladder outlet obstruction is suggested. There are small calcifications within the lumen of the urinary bladder, potentially retained small stones. Signed, Dulcy Fanny. Dellia Nims, RPVI [facility] Radiology Electronically Signed   By: Corrie Mckusick D.O.   On: 04/25/2018 17:26   Ir Thoracentesis Asp Pleural Space W/img Guide  Result Date: 04/26/2018 INDICATION: Patient with history of end-stage renal disease, left pleural effusion, gangrenous right foot. Request made for diagnostic and therapeutic left thoracentesis. EXAM: ULTRASOUND GUIDED DIAGNOSTIC AND THERAPEUTIC LEFT THORACENTESIS MEDICATIONS: None COMPLICATIONS: None immediate.  PROCEDURE: An ultrasound guided thoracentesis was thoroughly discussed with the patient and questions answered. The benefits, risks, alternatives and complications were also discussed. The patient understands and wishes to proceed with the procedure. Written consent was obtained. Ultrasound was performed to localize and mark an adequate pocket of fluid in the left chest. The area was then prepped and draped in the normal sterile fashion. 1% Lidocaine was used for local anesthesia. Under ultrasound guidance a 6 Fr Safe-T-Centesis catheter was introduced. Thoracentesis was performed. The catheter was removed and a dressing applied. FINDINGS: A total of approximately 720 cc of slightly hazy, amber fluid was removed. Samples were sent to the laboratory as requested by the clinical team. Due to patient chest discomfort only the above amount of fluid was removed today. IMPRESSION: Successful ultrasound guided diagnostic and therapeutic left thoracentesis yielding 720 cc of pleural fluid. Read by: Rowe Robert, PA-C Electronically Signed   By: Aletta Edouard M.D.   On: 04/26/2018 15:33       Subjective: Patient seen and examined at bedside.  Patient is repeat, with troponin elevated but hardly answers any questions.  Looks very fidgety intermittently.  As per nursing staff, patient has been trying to remove heart monitor wires and has been intermittently agitated.  Discharge Exam: Vitals:   04/26/18 2207  04/27/18 0544  BP: 122/76 (!) 75/51  Pulse: 72 70  Resp: (!) 21 14  Temp: (!) 97.4 F (36.3 C) 97.6 F (36.4 C)  SpO2:  100%    General exam: Appears very thinly built.  Looks chronically ill.  Hardly wakes up on calling his name.  No acute distress.  Looks intermittently fidgety. Respiratory system: Bilateral decreased breath sounds at bases with scattered crackles.  No wheezing Cardiovascular system: Rate controlled, S1-S2 heard. Gastrointestinal system: Abdomen is nondistended, soft and  nontender. Normal bowel sounds heard. Extremities: No cyanosis, clubbing; right foot necrotic changes present   The results of significant diagnostics from this hospitalization (including imaging, microbiology, ancillary and laboratory) are listed below for reference.     Microbiology: Recent Results (from the past 240 hour(s))  Blood culture (routine x 2)     Status: None (Preliminary result)   Collection Time: 04/25/18  6:00 PM  Result Value Ref Range Status   Specimen Description BLOOD LEFT ANTECUBITAL  Final   Special Requests   Final    BOTTLES DRAWN AEROBIC AND ANAEROBIC Blood Culture adequate volume   Culture   Final    NO GROWTH 2 DAYS Performed at [facility], [address], [place], [place] [address]    Report Status PENDING  Incomplete  Blood culture (routine x 2)     Status: None (Preliminary result)   Collection Time: 04/25/18  6:12 PM  Result Value Ref Range Status   Specimen Description BLOOD LEFT FOREARM  Final   Special Requests   Final    BOTTLES DRAWN AEROBIC ONLY Blood Culture results may not be optimal due to an inadequate volume of blood received in culture bottles   Culture   Final    NO GROWTH 2 DAYS Performed at [facility], [place] [address] [place], [place] [address]    Report Status PENDING  Incomplete  Gram stain     Status: None   Collection Time: 04/26/18  3:29 PM  Result Value Ref Range Status   Specimen Description PLEURAL LEFT  Final   Special Requests NONE  Final   Gram Stain   Final    ABUNDANT WBC PRESENT, PREDOMINANTLY MONONUCLEAR NO ORGANISMS SEEN Performed at [facility], [address] [place], [place] [address]    Report Status 04/26/2018 FINAL  Final  Culture, body fluid-bottle     Status: None (Preliminary result)   Collection Time: 04/26/18  3:29 PM  Result Value Ref Range Status   Specimen Description PLEURAL LEFT  Final   Special Requests NONE  Final   Culture   Final    NO GROWTH < 24 HOURS Performed  at [facility], [place] [address], [place], [place] [address]    Report Status PENDING  Incomplete     Labs: BNP (last 3 results) No results for input(s): BNP in the last 8760 hours. Basic Metabolic Panel: Recent Labs  Lab 04/25/18 1532 04/25/18 1607 04/26/18 0339 04/27/18 0305  NA 140 139 137 136  K 5.3* 5.4* 5.2* 5.4*  CL 96*  --  96* 98  CO2 20*  --  21* 19*  GLUCOSE 71  --  64* 68*  BUN 92*  --  92* 85*  CREATININE 14.28*  --  13.58* 12.79*  CALCIUM 8.9  --  8.4* 8.7*  MG  --   --  2.9* 2.7*  PHOS  --   --  9.3*  --  Liver Function Tests: Recent Labs  Lab 04/25/18 1532 04/27/18 0305  AST 16 19  ALT 8 10  ALKPHOS 77 68  BILITOT 0.9 0.8  PROT 8.9* 7.4  ALBUMIN 3.2* 2.6*   Recent Labs  Lab 04/25/18 1532  LIPASE 25   Recent Labs  Lab 04/27/18 0305  AMMONIA 41*   CBC: Recent Labs  Lab 04/25/18 1532 04/25/18 1607 04/26/18 0339 04/26/18 2350 04/27/18 0305  WBC 5.7  --  5.2 5.2 4.8  NEUTROABS  --   --  3.5  --  3.2  HGB 10.9* 11.9* 9.9* 9.2* 9.1*  HCT 35.3* 35.0* 31.7* 29.3* 29.4*  MCV 89.6  --  87.8 88.5 88.6  PLT 117*  --  98* 77* 74*   Cardiac Enzymes: Recent Labs  Lab 04/25/18 1532 04/25/18 1813 04/25/18 1921 04/25/18 2157  TROPONINI 0.10* 0.07* 0.07* 0.06*   BNP: Invalid input(s): POCBNP CBG: Recent Labs  Lab 04/26/18 1315 04/26/18 1639 04/26/18 2216 04/27/18 0356 04/27/18 0541  GLUCAP 142* 71 72 68* 107*   D-Dimer No results for input(s): DDIMER in the last 72 hours. Hgb A1c Recent Labs    04/26/18 0339  HGBA1C 6.0*   Lipid Profile No results for input(s): CHOL, HDL, LDLCALC, TRIG, CHOLHDL, LDLDIRECT in the last 72 hours. Thyroid function studies Recent Labs    04/27/18 0305  TSH 6.356*   Anemia work up Recent Labs    04/27/18 0305  VITAMINB12 1,227*  FOLATE 25.1   Urinalysis No results found for: COLORURINE, APPEARANCEUR, LABSPEC, [place], GLUCOSEU, HGBUR, BILIRUBINUR, KETONESUR, PROTEINUR,  UROBILINOGEN, NITRITE, LEUKOCYTESUR Sepsis Labs Invalid input(s): PROCALCITONIN,  WBC,  LACTICIDVEN Microbiology Recent Results (from the past 240 hour(s))  Blood culture (routine x 2)     Status: None (Preliminary result)   Collection Time: 04/25/18  6:00 PM  Result Value Ref Range Status   Specimen Description BLOOD LEFT ANTECUBITAL  Final   Special Requests   Final    BOTTLES DRAWN AEROBIC AND ANAEROBIC Blood Culture adequate volume   Culture   Final    NO GROWTH 2 DAYS Performed at [facility], [place] [address] [place], [place] [address]    Report Status PENDING  Incomplete  Blood culture (routine x 2)     Status: None (Preliminary result)   Collection Time: 04/25/18  6:12 PM  Result Value Ref Range Status   Specimen Description BLOOD LEFT FOREARM  Final   Special Requests   Final    BOTTLES DRAWN AEROBIC ONLY Blood Culture results may not be optimal due to an inadequate volume of blood received in culture bottles   Culture   Final    NO GROWTH 2 DAYS Performed at [facility], [place] [address], [place], [place] [address]    Report Status PENDING  Incomplete  Gram stain     Status: None   Collection Time: 04/26/18  3:29 PM  Result Value Ref Range Status   Specimen Description PLEURAL LEFT  Final   Special Requests NONE  Final   Gram Stain   Final    ABUNDANT WBC PRESENT, PREDOMINANTLY MONONUCLEAR NO ORGANISMS SEEN Performed at [facility], [address] [place], [place] [address]    Report Status 04/26/2018 FINAL  Final  Culture, body fluid-bottle     Status: None (Preliminary result)   Collection Time: 04/26/18  3:29 PM  Result Value Ref Range Status   Specimen Description PLEURAL LEFT  Final   Special Requests NONE  Final   Culture   Final    NO GROWTH < 24 HOURS Performed at [facility], [place] [address] [place], [place] [address]    Report Status PENDING  Incomplete     Time coordinating discharge: 35 minutes  SIGNED:   Aline August, MD  [facility] 04/27/2018, 12:13 PM

## 2018-04-27 NOTE — Progress Notes (Addendum)
[facility] Progress Note   Subjective:   Patient seen in room, awake and follows some commands but does not answer questions. AVF infiltrated yesterday after 1 hour of treatment, only 135 mL removed. K+ 5.4, BUN still elevated at 85. Laying on arm and resistant to repositioning for exam. Spanish speaking staff assisted, patient just reporting he is hungry. BP 99/68.   Objective Vitals:   04/26/18 2100 04/26/18 2207 04/27/18 0502 04/27/18 0544  BP: 115/71 122/76  (!) 75/51  Pulse: 69 72  70  Resp: 20 (!) 21  14  Temp: 98.1 F (36.7 C) (!) 97.4 F (36.3 C)  97.6 F (36.4 C)  TempSrc: Oral Oral Oral Oral  SpO2: 91%   100%  Weight: 40.4 kg      Physical Exam General: Cachectic male, awake and in NAD Heart: RRR, no murmurs, rubs or gallops Lungs: CTA bilaterally with decreased BS at bases. No wheezing or rales Abdomen: Soft, non-tender, non-distended. +BS Extremities: Gangrenous toes on R foot.  Dialysis Access:  Exam somewhat limited due to patient position as above. LUE AVF + bruit, mild edema noted.  No edema bilateral lower extremities.   Additional Objective Labs: Basic Metabolic Panel: Recent Labs  Lab 04/25/18 1532 04/25/18 1607 04/26/18 0339 04/27/18 0305  NA 140 139 137 136  K 5.3* 5.4* 5.2* 5.4*  CL 96*  --  96* 98  CO2 20*  --  21* 19*  GLUCOSE 71  --  64* 68*  BUN 92*  --  92* 85*  CREATININE 14.28*  --  13.58* 12.79*  CALCIUM 8.9  --  8.4* 8.7*  PHOS  --   --  9.3*  --    Liver Function Tests: Recent Labs  Lab 04/25/18 1532 04/27/18 0305  AST 16 19  ALT 8 10  ALKPHOS 77 68  BILITOT 0.9 0.8  PROT 8.9* 7.4  ALBUMIN 3.2* 2.6*   Recent Labs  Lab 04/25/18 1532  LIPASE 25   CBC: Recent Labs  Lab 04/25/18 1532  04/26/18 0339 04/26/18 2350 04/27/18 0305  WBC 5.7  --  5.2 5.2 4.8  NEUTROABS  --   --  3.5  --  3.2  HGB 10.9*   < > 9.9* 9.2* 9.1*  HCT 35.3*   < > 31.7* 29.3* 29.4*  MCV 89.6  --  87.8 88.5 88.6  PLT 117*  --   98* 77* 74*   < > = values in this interval not displayed.   Blood Culture    Component Value Date/Time   SDES PLEURAL LEFT 04/26/2018 1529   SPECREQUEST NONE 04/26/2018 1529   CULT  04/25/2018 1812    NO GROWTH < 24 HOURS Performed at [facility], [place] [address], [place], [place] [address]    REPTSTATUS 04/26/2018 FINAL 04/26/2018 1529    Cardiac Enzymes: Recent Labs  Lab 04/25/18 1532 04/25/18 1813 04/25/18 1921 04/25/18 2157  TROPONINI 0.10* 0.07* 0.07* 0.06*   CBG: Recent Labs  Lab 04/26/18 1315 04/26/18 1639 04/26/18 2216 04/27/18 0356 04/27/18 0541  GLUCAP 142* 71 72 68* 107*   Iron Studies: No results for input(s): IRON, TIBC, TRANSFERRIN, FERRITIN in the last 72 hours. '@[email or web address]' @ Studies/Results: Dg Chest 1 View  Result Date: 04/26/2018 CLINICAL DATA:  Status post left thoracentesis. EXAM: CHEST  1 VIEW COMPARISON:  Radiograph of April 25, 2018. FINDINGS: Stable cardiomegaly. Atherosclerosis of thoracic aorta is noted. Grossly stable left pleural effusion is noted with associated atelectasis or infiltrate.  No pneumothorax is noted. Right lung is hyperexpanded, but otherwise normal. Bony thorax unremarkable. IMPRESSION: No pneumothorax status post left thoracentesis. Aortic Atherosclerosis (ICD10-I70.0). Electronically Signed   By: Marijo Conception M.D.   On: 04/26/2018 15:44   Ct Head Wo Contrast  Result Date: 04/25/2018 CLINICAL DATA:  Altered level of consciousness EXAM: CT HEAD WITHOUT CONTRAST TECHNIQUE: Contiguous axial images were obtained from the base of the skull through the vertex without intravenous contrast. COMPARISON:  None. FINDINGS: Brain: No evidence of acute infarction, hemorrhage, extra-axial collection, ventriculomegaly, or mass effect. Generalized cerebral atrophy. Periventricular white matter low attenuation likely secondary to microangiopathy. Vascular: Cerebrovascular atherosclerotic calcifications are noted. Skull: Negative for  fracture or focal lesion. Sinuses/Orbits: Visualized portions of the orbits are unremarkable. Mastoid sinuses are clear. Right maxillary sinus mucosal thickening. Other: None. IMPRESSION: No acute intracranial pathology. Electronically Signed   By: Kathreen Devoid   On: 04/25/2018 16:59   Ct Angio Aortobifemoral W And/or Wo Contrast  Result Date: 04/25/2018 CLINICAL DATA:  73 year old male with a history of right foot wound and concern for chronic limb threatening ischemia EXAM: CT ANGIOGRAPHY OF CHEST ABDOMINAL AORTA WITH ILIOFEMORAL RUNOFF TECHNIQUE: Multidetector CT imaging of the chest, abdomen, pelvis and lower extremities was performed using the standard protocol during bolus administration of intravenous contrast, following multi detector spiral CT imaging of the chest without contrast. Multiplanar CT image reconstructions and MIPs were obtained to evaluate the vascular anatomy. CONTRAST:  149m OMNIPAQUE IOHEXOL 350 MG/ML SOLN COMPARISON:  None. FINDINGS: Chest: Cardiovascular: Heart: Cardiomegaly. No pericardial fluid/thickening. Differential attenuation of the blood pool and the myocardium on the precontrast images. Dense calcifications of left main, left anterior descending, circumflex, right coronary arteries. Aorta: No aneurysm or dissection. Atherosclerotic changes of the thoracic aorta. Pulmonary arteries: No central, lobar, segmental, or proximal subsegmental filling defects. Mediastinum/Nodes: No significant mediastinal adenopathy, with the sensitivity decreased given the patient's body habitus and timing of the contrast bolus. Unremarkable appearance of the thoracic esophagus. Unremarkable thoracic inlet. Lungs/Pleura: Central airways are clear. Right lung clear with no confluent airspace disease, pneumothorax or pleural effusion. Right-sided pleural effusion with near complete atelectasis of the right lower lobe. Apicoposterior segment of the left upper lobe is aerated. Near complete collapse of  the lingula. No pneumothorax. Musculoskeletal: No acute displaced fracture. Degenerative changes of the spine. Review of the MIP images confirms the above findings. Abdomen/pelvis: VASCULAR Aorta: Atherosclerotic changes of the abdominal aorta. No aneurysm or dissection. No definite evidence of periaortic fluid. Celiac: Atherosclerotic changes at the celiac artery origin. Branches are patent. There is preferential calcification of distal branches/small vessels of the splenic artery. SMA: SMA patent with mild atherosclerotic changes at the origin. Preferential calcification of the distal branches of the SMA distribution. Renals: Mixed calcified and soft plaque at the origin of the right renal artery with at least 50% stenosis. Dense calcifications of the distal right renal arterial distribution. On the left there is atherosclerotic changes at the origin of the renal artery. Symmetric to the right there is dense calcifications of the small vessels of the left renal artery distribution. IMA: IMA remains patent with dense calcifications at the origin. Right lower extremity: Advanced atherosclerosis of the right iliac system with no high-grade stenosis or occlusion. No aneurysm or dissection. Hypogastric artery is patent with advanced atherosclerosis of the pelvic arteries. Common femoral artery patent with mild atherosclerotic changes. Profunda femoris patent with patent thigh branches. Dense calcifications. There is circumferential calcifications of nearly the entire native SFA  from the origin through the adductor canal and the popliteal artery. Degree of calcification limits the sensitivity for luminal evaluation, however, the SFA appears patent. Beyond the adductor canal, the study is nondiagnostic. There is nearly complete circumferential calcification of all 3 tibial arteries to the ankle yielding the CT a nondiagnostic. Left lower extremity: Advanced atherosclerosis of the left iliac system with no high-grade  stenosis or occlusion identified. No aneurysm or dissection. Hypogastric artery patent with advanced atherosclerosis of the pelvic arteries. Common femoral artery with mild atherosclerotic changes. Profunda femoris is patent as well as the thigh branches. Advanced circumferential atherosclerotic calcifications of the thigh branches. There is circumferential calcification of nearly the entire length of the left SFA. The degree of calcification limits the sensitivity for the evaluation of luminal narrowing, however, the SFA appears patent to the popliteal artery. Beyond the knee, the study is nondiagnostic. There is nearly complete circumferential calcification of all 3 tibial vessels to the ankle, yielding the CTa nondiagnostic. Veins: Unremarkable appearance of the venous system. Review of the MIP images confirms the above findings. NON-VASCULAR The timing of the contrast bolus limits the evaluation of the abdomen and pelvis given the patient's lean body habitus. Hepatobiliary: Relatively unremarkable liver. Calcified stones within the gallbladder. Pancreas: Pancreas not well evaluated. Spleen: Unremarkable Adrenals/Urinary Tract: Adrenal glands not well visualized. Right: Atrophic right kidney with no hydronephrosis. Left: Atrophic left kidney with no hydronephrosis. Urinary bladder demonstrates calcified stones at the dependent aspect. Circumferential wall thickening. Stomach/Bowel: Relatively unremarkable stomach. Stomach wall not well visualized. No abnormally distended small bowel though the bowel wall not well evaluated. No evidence of obstruction. Appendix is not visualized, however, no inflammatory changes are present adjacent to the cecum to indicate an appendicitis. Colon is decompressed. Colon wall not well evaluated. No significant stool burden or evidence of obstruction. Lymphatic: No significant adenopathy. Mesenteric: No free fluid or air. No adenopathy. Reproductive: Prostatomegaly, with estimated  prostate diameter 4.9 cm Other: Cachectic abdominal wall.  No hernia. Musculoskeletal: Advanced degenerative changes of the lumbar spine. Worst degree of degenerative changes at the level of L3-L4 and L4-L5. Vacuum disc phenomenon spans all levels of the lumbar spine. Posterior disc osteophyte of L3-L4 narrows the canal by 50%. No acute displaced fracture. IMPRESSION: No CT evidence of acute aortic syndrome. Multilevel atherosclerosis (diffuse Monckeberg's pattern), including -aortic atherosclerosis with no occlusion or aneurysm. Aortic Atherosclerosis (ICD10-I70.0). -bilateral iliac atherosclerosis with no high-grade stenosis or occlusion -bilateral femoral popliteal atherosclerosis. The degree of circumferential, long segment calcification limits the sensitivity for the assessment of high-grade stenosis of the femoral popliteal segment, however, the bilateral SFA and popliteal artery appear patent to the knees -advanced bilateral tibial disease. Beyond the knees, the study is essentially nondiagnostic given the circumferential long segment calcification of the tibial arteries, form origin to ankles. Correlation with noninvasive testing is indicated. 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 There may be developing stenosis at the origin of the IMA. 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 Advanced coronary arterial disease. Left-sided pleural effusion with partial collapse of the left upper lobe and left lower lobe. Follow-up CT imaging is indicated once the patient has been treated to assure resolution of the atelectasis and rule out the possibility of persisting nodule/mass. Trace ascites, uncertain origin. The abdomen is not well evaluated given the timing of the contrast bolus and the patient's body habitus. If there is concern for acute abdominal process (other than a vascular process) standard contrast-enhanced CT imaging should be considered.  Prostatomegaly, which may account  for the circumferential bladder wall thickening. Correlation with any history of bladder outlet obstruction is suggested. There are small calcifications within the lumen of the urinary bladder, potentially retained small stones. Signed, Dulcy Fanny. Dellia Nims, RPVI [facility] Electronically Signed   By: Corrie Mckusick D.O.   On: 04/25/2018 17:26   Dg Chest Port 1 View  Result Date: 04/25/2018 CLINICAL DATA:  Sudden onset abdominal and back pain during dialysis. EXAM: PORTABLE CHEST 1 VIEW COMPARISON:  None. FINDINGS: Cardiomegaly. The left heart border is obscured. No mediastinal shift. Atherosclerotic calcification of the aortic arch. Normal pulmonary vascularity. Large, loculated left pleural effusion with underlying atelectasis/consolidation. The right lung is clear. No pneumothorax. No acute osseous abnormality. IMPRESSION: 1. Large, loculated left pleural effusion with underlying atelectasis/consolidation. 2.  Aortic atherosclerosis (ICD10-I70.0). Electronically Signed   By: Titus Dubin M.D.   On: 04/25/2018 15:59   Ct Angio Chest Aorta W And/or Wo Contrast  Result Date: 04/25/2018 CLINICAL DATA:  73 year old male with a history of right foot wound and concern for chronic limb threatening ischemia EXAM: CT ANGIOGRAPHY OF CHEST ABDOMINAL AORTA WITH ILIOFEMORAL RUNOFF TECHNIQUE: Multidetector CT imaging of the chest, abdomen, pelvis and lower extremities was performed using the standard protocol during bolus administration of intravenous contrast, following multi detector spiral CT imaging of the chest without contrast. Multiplanar CT image reconstructions and MIPs were obtained to evaluate the vascular anatomy. CONTRAST:  172m OMNIPAQUE IOHEXOL 350 MG/ML SOLN COMPARISON:  None. FINDINGS: Chest: Cardiovascular: Heart: Cardiomegaly. No pericardial fluid/thickening. Differential attenuation of the blood pool and the myocardium on the precontrast  images. Dense calcifications of left main, left anterior descending, circumflex, right coronary arteries. Aorta: No aneurysm or dissection. Atherosclerotic changes of the thoracic aorta. Pulmonary arteries: No central, lobar, segmental, or proximal subsegmental filling defects. Mediastinum/Nodes: No significant mediastinal adenopathy, with the sensitivity decreased given the patient's body habitus and timing of the contrast bolus. Unremarkable appearance of the thoracic esophagus. Unremarkable thoracic inlet. Lungs/Pleura: Central airways are clear. Right lung clear with no confluent airspace disease, pneumothorax or pleural effusion. Right-sided pleural effusion with near complete atelectasis of the right lower lobe. Apicoposterior segment of the left upper lobe is aerated. Near complete collapse of the lingula. No pneumothorax. Musculoskeletal: No acute displaced fracture. Degenerative changes of the spine. Review of the MIP images confirms the above findings. Abdomen/pelvis: VASCULAR Aorta: Atherosclerotic changes of the abdominal aorta. No aneurysm or dissection. No definite evidence of periaortic fluid. Celiac: Atherosclerotic changes at the celiac artery origin. Branches are patent. There is preferential calcification of distal branches/small vessels of the splenic artery. SMA: SMA patent with mild atherosclerotic changes at the origin. Preferential calcification of the distal branches of the SMA distribution. Renals: Mixed calcified and soft plaque at the origin of the right renal artery with at least 50% stenosis. Dense calcifications of the distal right renal arterial distribution. On the left there is atherosclerotic changes at the origin of the renal artery. Symmetric to the right there is dense calcifications of the small vessels of the left renal artery distribution. IMA: IMA remains patent with dense calcifications at the origin. Right lower extremity: Advanced atherosclerosis of the right iliac system  with no high-grade stenosis or occlusion. No aneurysm or dissection. Hypogastric artery is patent with advanced atherosclerosis of the pelvic arteries. Common femoral artery patent with mild atherosclerotic changes. Profunda femoris patent with patent thigh branches. Dense calcifications. There is circumferential calcifications of nearly  the entire native SFA from the origin through the adductor canal and the popliteal artery. Degree of calcification limits the sensitivity for luminal evaluation, however, the SFA appears patent. Beyond the adductor canal, the study is nondiagnostic. There is nearly complete circumferential calcification of all 3 tibial arteries to the ankle yielding the CT a nondiagnostic. Left lower extremity: Advanced atherosclerosis of the left iliac system with no high-grade stenosis or occlusion identified. No aneurysm or dissection. Hypogastric artery patent with advanced atherosclerosis of the pelvic arteries. Common femoral artery with mild atherosclerotic changes. Profunda femoris is patent as well as the thigh branches. Advanced circumferential atherosclerotic calcifications of the thigh branches. There is circumferential calcification of nearly the entire length of the left SFA. The degree of calcification limits the sensitivity for the evaluation of luminal narrowing, however, the SFA appears patent to the popliteal artery. Beyond the knee, the study is nondiagnostic. There is nearly complete circumferential calcification of all 3 tibial vessels to the ankle, yielding the CTa nondiagnostic. Veins: Unremarkable appearance of the venous system. Review of the MIP images confirms the above findings. NON-VASCULAR The timing of the contrast bolus limits the evaluation of the abdomen and pelvis given the patient's lean body habitus. Hepatobiliary: Relatively unremarkable liver. Calcified stones within the gallbladder. Pancreas: Pancreas not well evaluated. Spleen: Unremarkable Adrenals/Urinary  Tract: Adrenal glands not well visualized. Right: Atrophic right kidney with no hydronephrosis. Left: Atrophic left kidney with no hydronephrosis. Urinary bladder demonstrates calcified stones at the dependent aspect. Circumferential wall thickening. Stomach/Bowel: Relatively unremarkable stomach. Stomach wall not well visualized. No abnormally distended small bowel though the bowel wall not well evaluated. No evidence of obstruction. Appendix is not visualized, however, no inflammatory changes are present adjacent to the cecum to indicate an appendicitis. Colon is decompressed. Colon wall not well evaluated. No significant stool burden or evidence of obstruction. Lymphatic: No significant adenopathy. Mesenteric: No free fluid or air. No adenopathy. Reproductive: Prostatomegaly, with estimated prostate diameter 4.9 cm Other: Cachectic abdominal wall.  No hernia. Musculoskeletal: Advanced degenerative changes of the lumbar spine. Worst degree of degenerative changes at the level of L3-L4 and L4-L5. Vacuum disc phenomenon spans all levels of the lumbar spine. Posterior disc osteophyte of L3-L4 narrows the canal by 50%. No acute displaced fracture. IMPRESSION: No CT evidence of acute aortic syndrome. Multilevel atherosclerosis (diffuse Monckeberg's pattern), including -aortic atherosclerosis with no occlusion or aneurysm. Aortic Atherosclerosis (ICD10-I70.0). -bilateral iliac atherosclerosis with no high-grade stenosis or occlusion -bilateral femoral popliteal atherosclerosis. The degree of circumferential, long segment calcification limits the sensitivity for the assessment of high-grade stenosis of the femoral popliteal segment, however, the bilateral SFA and popliteal artery appear patent to the knees -advanced bilateral tibial disease. Beyond the knees, the study is essentially nondiagnostic given the circumferential long segment calcification of the tibial arteries, form origin to ankles. Correlation with  noninvasive testing is indicated. 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 There may be developing stenosis at the origin of the IMA. 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 Advanced coronary arterial disease. Left-sided pleural effusion with partial collapse of the left upper lobe and left lower lobe. Follow-up CT imaging is indicated once the patient has been treated to assure resolution of the atelectasis and rule out the possibility of persisting nodule/mass. Trace ascites, uncertain origin. The abdomen is not well evaluated given the timing of the contrast bolus and the patient's body habitus. If there is concern for acute abdominal process (other than a vascular process) standard contrast-enhanced CT  imaging should be considered. Prostatomegaly, which may account for the circumferential bladder wall thickening. Correlation with any history of bladder outlet obstruction is suggested. There are small calcifications within the lumen of the urinary bladder, potentially retained small stones. Signed, Dulcy Fanny. Dellia Nims, RPVI [facility] Electronically Signed   By: Corrie Mckusick D.O.   On: 04/25/2018 17:26   Ir Thoracentesis Asp Pleural Space W/img Guide  Result Date: 04/26/2018 INDICATION: Patient with history of end-stage renal disease, left pleural effusion, gangrenous right foot. Request made for diagnostic and therapeutic left thoracentesis. EXAM: ULTRASOUND GUIDED DIAGNOSTIC AND THERAPEUTIC LEFT THORACENTESIS MEDICATIONS: None COMPLICATIONS: None immediate. PROCEDURE: An ultrasound guided thoracentesis was thoroughly discussed with the patient and questions answered. The benefits, risks, alternatives and complications were also discussed. The patient understands and wishes to proceed with the procedure. Written consent was obtained. Ultrasound was performed to localize and mark an adequate pocket of fluid in the  left chest. The area was then prepped and draped in the normal sterile fashion. 1% Lidocaine was used for local anesthesia. Under ultrasound guidance a 6 Fr Safe-T-Centesis catheter was introduced. Thoracentesis was performed. The catheter was removed and a dressing applied. FINDINGS: A total of approximately 720 cc of slightly hazy, amber fluid was removed. Samples were sent to the laboratory as requested by the clinical team. Due to patient chest discomfort only the above amount of fluid was removed today. IMPRESSION: Successful ultrasound guided diagnostic and therapeutic left thoracentesis yielding 720 cc of pleural fluid. Read by: Rowe Robert, PA-C Electronically Signed   By: Aletta Edouard M.D.   On: 04/26/2018 15:33   Medications: . sodium chloride    . sodium chloride    . dextrose 50 mL/hr at 04/26/18 1108  . piperacillin-tazobactam (ZOSYN)  IV 3.375 g (04/26/18 2358)  . vancomycin     . acidophilus  1 capsule Oral Daily  . atorvastatin  20 mg Oral Daily  . Chlorhexidine Gluconate Cloth  6 each Topical Q0600  . Chlorhexidine Gluconate Cloth  6 each Topical Q0600  . feeding supplement (ENSURE ENLIVE)  237 mL Oral BID BM  . haloperidol lactate  2.5 mg Intravenous Once  . heparin  2,000 Units Dialysis Once in dialysis  . heparin  5,000 Units Subcutaneous Q8H  . insulin aspart  0-9 Units Subcutaneous TID WC  . multivitamin  1 tablet Oral QHS  . pantoprazole (PROTONIX) IV  40 mg Intravenous Q24H  . sevelamer carbonate  1,600 mg Oral TID WC  . sodium zirconium cyclosilicate  5 g Oral Once  . vancomycin  500 mg Intravenous Q T,Th,Sa-HD    Dialysis Orders:  [facility], TTS Time 3.5 hrs, edw 43kg, 2K/2.25 Ca, BFR400, DFR A1.5, left cimino AVF Heparin 2,000 units ivp, hectoral 24mg IVP tid, micera 100 mcg every 2 weeks (last given 03/16/18 due to noncompliance with HD)  Assessment/Plan: 1. 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  VVS following and may require RAKA.  Per primary will have Palliative care meet with family and decide about goals/limits of care before proceeding with amputation. 2. Pleural effusion: Chest x-ray showed large loculated left pleural effusion. Underwent thoracentesis with 720 cc of hazy, amber fluid removed on 4/22. No respiratory distress at present. Will plan for volume removal as tolerated with HD. 3. ESRD:  Continue with HD until goals of care established. Only 1 hr treatment yesterday due to venous infiltration, was apparently having some  issues with access as outpatient as well. Currently with + bruit. Will try to cannulate fistula again today with smaller needles/decreased BFR. Will need to consider [facility] if access fails, however this will depend on goals of care.  4. Hyperkalemia: plan for HD today.  Due to noncompliance with HD as he had gone almost 3 weeks without HD. 5. 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 Phos elevated at 9.3. Continue with binders and vit D 7. Nutrition: renal diet 8. Hypertension: Soft BP noted overnight. Will attempt UFG 1.5L as blood pressure tolerates as patient did present with pleural effusion.  9.    Disposition: apparently has had some discussion about stopping dialysis and await Palliative care consult to help set goals/limits of care and proceed based on there discussions with the family and the patient.   ADDENDUM: Family met with Palliative care, decided to discharge home with hospice. Will not complete hemodialysis.   Anice Paganini, PA-C 04/27/2018, 9:45 AM  [facility] Pager: [phone]  I have seen and examined this patient and agree with plan and assessment in the above note with renal recommendations/intervention highlighted.  Decision by family to transition to comfort care and home with hospice noted.  HD orders cancelled.  Broadus John A Delbra Zellars,MD 04/27/2018 2:13  PM

## 2018-04-27 NOTE — Progress Notes (Signed)
Inpatient Diabetes Program Recommendations  AACE/ADA: New Consensus Statement on Inpatient Glycemic Control (2015)  Target Ranges:  Prepandial:   less than 140 mg/dL      Peak postprandial:   less than 180 mg/dL (1-2 hours)      Critically ill patients:  140 - 180 mg/dL   Lab Results  Component Value Date   GLUCAP 107 (H) 04/27/2018   HGBA1C 6.0 (H) 04/26/2018    Review of Glycemic Control  Results for Ronald, Lopez (MRN [id number]) as of 04/27/2018 09:39  Ref. Range 04/26/2018 06:17 04/26/2018 07:04 04/26/2018 12:03 04/26/2018 12:43 04/26/2018 13:15 04/26/2018 16:39 04/26/2018 22:16 04/27/2018 03:56 04/27/2018 05:41  Glucose-Capillary Latest Ref Range: 70 - 99 mg/dL 44 (LL) 140 (H) 62 (L) 57 (L) 142 (H) 71 72 68 (L) 107 (H)    Inpatient Diabetes Program Recommendations:    Still having hypoglycemia. Consider switching fluids to D10 with more glucose and less volume with renal status.  Thanks,  Tama Headings RN, MSN, BC-ADM Inpatient Diabetes Coordinator Team Pager [phone] (8a-5p)

## 2018-04-27 NOTE — Progress Notes (Addendum)
Manufacturing engineer [facility])  Hospice  Received referral from [facility], for home hospice support after discharged.  Patient and chart under review by [facility] physician and eligibility is pending at this time.  Spoke with son Mallie Mussel, he confirmed interest.    Plan is to d/c today by ambulance to [facility] home.    DME discussed, will need hospital bed.  [facility] will order this and try to have it delivered today.  Mallie Mussel was ok to bring his father home regardless of bed being delivered prior to or after discharge.  Please fax d/c summary to:  [phone]  Please send any medication prescriptions needed for comfort for the pt until hospice services can be started.  Thank you, Farrel Gordon, RN, [place] (in [place]

## 2018-04-27 NOTE — TOC Initial Note (Signed)
Transition of Care (TOC) - Initial/Assessment Note  Marvetta Gibbons RN, BSN Transitions of Care Unit 4E- RN Case Manager [phone]  Patient Details  Name: Ronald Lopez MRN: [id number] Date of Birth: September 09, 1945  Transition of Care [facility]) CM/SW Contact:    Dawayne Patricia, RN Phone Number: [phone] 04/27/2018, 11:21 AM  Clinical Narrative:                 Pt admitted with AMS, gangrene of foot, ESRD, PC consulted for [place], received consult for home hospice- call made to son Mallie Mussel to discuss home hospice choice- list provided Per CMS guidelines from medicare.gov website with star ratings (copy placed in shadow chart)- via TC- per son they do not have a preference just to be sure that agency will work with them as patient only has emergent Medicaid for HD and no other insurance (undocumented citizen) also discussed DME needs- Hospital bed requested. Plan to transport via non emergent EMS-[facility]. Pt will need GOLD DNR form signed for transport.  Call made to [facility] with [facility] for home hospice needs- she will f/u and confirm eligibility for possible transition home today. Will await Hospice confirmation.   Expected Discharge Plan: Home w Hospice Care Barriers to Discharge: No Barriers Identified   Patient Goals and CMS Choice Patient states their goals for this hospitalization and ongoing recovery are:: "comfort care" CMS Medicare.gov Compare Post Acute Care list provided to:: Patient Represenative (must comment) Choice offered to / list presented to : Adult Children  Expected Discharge Plan and Services Expected Discharge Plan: Home w Hospice Care In-house Referral: Hospice / Palliative Care Discharge Planning Services: CM Consult Post Acute Care Choice: Hospice Living arrangements for the past 2 months: Single Family Home                 DME Arranged: Hospice Equipment Package A   Date DME Agency Contacted: 04/27/18 Time DME Agency Contacted: Camillo Flaming to contact  for DME) Representative spoke with at DME Agency: (pt going home with Hospice- DME arranged via [place]) [place] Arranged: Disease Management [place] Agency: Hospice and [place] Date [place]: 04/27/18 Time [place]: [id number] Representative spoke with at [place]: Venia Carbon  Prior Living Arrangements/Services Living arrangements for the past 2 months: [place] with:: Relatives Patient language and need for interpreter reviewed:: Yes Do you feel safe going back to the place where you live?: No   Pt confused unable to answer  Need for Family Participation in Patient Care: Yes (Comment) Care giver support system in place?: Yes (comment)   Criminal Activity/Legal Involvement Pertinent to Current Situation/Hospitalization: No - Comment as needed  Activities of Daily Living      Permission Sought/Granted Permission sought to share information with : Facility Sport and exercise psychologist, Family Supports(per son- Mallie Mussel) Permission granted to share information with : Yes, Verbal Permission Granted  Share Information with NAME: Melquiades Kovar  Permission granted to share info w AGENCY: Hospice  Permission granted to share info w Relationship: son     Emotional Assessment Appearance:: Appears stated age Attitude/Demeanor/Rapport: Unable to Assess(confused) Affect (typically observed): Unable to Assess Orientation: : Oriented to Self   Psych Involvement: No (comment)  Admission diagnosis:  Diagnosis unknown [R69] Abdominal pain [R10.9] Patient Active Problem List   Diagnosis Date Noted  . AMS (altered mental status) 04/25/2018  . ESRD (end stage renal disease) ([place]) 04/25/2018  . Diabetes ([place]) 04/25/2018  . Pleural effusion 04/25/2018  . Atherosclerosis 04/25/2018  .  Elevated troponin 04/25/2018  . Hyperkalemia 04/25/2018  . Anemia of chronic disease 04/25/2018  . Severe malnutrition ([place]) 04/25/2018  . Abdominal pain     PCP:  Patient, No Pcp Per Pharmacy:   [id number] - [place], [address] [place] [address] Phone: [phone] Fax: [phone]     Social Determinants of Health (SDOH) Interventions    Readmission Risk Interventions Readmission Risk Prevention Plan 04/27/2018  Transportation Screening Complete  PCP or Specialist Appt within 3-5 Days Complete  HRI or [place] Complete  Social Work Consult for [place] Planning/Counseling Not Complete  SW consult not completed comments pt going home with hospice  Palliative Care Screening Complete  Medication Review Press photographer) Complete

## 2018-04-27 NOTE — Progress Notes (Signed)
Pt arrived to the floor, VSS, patient agitated but the spanish translator facilitates our communication. Pt is laying down in bed alert and calm. We'll continue to monitor.

## 2018-04-27 NOTE — Progress Notes (Signed)
Patient ID: Ronald Lopez, male   DOB: April 21, 1945, 73 y.o.   MRN: [id number]  PROGRESS NOTE    Ronald Lopez  DUK:[id number] DOB: 09-13-1945 DOA: 04/25/2018 PCP: Patient, No Pcp Per   Brief Narrative:  73 year old male with history of hypertension, hyperlipidemia, diabetes mellitus, end-stage renal disease on hemodialysis presented on 04/25/2018 with abdominal pain and confusion.  He was found to have right foot necrotic changes.  Chest x-ray showed a large, loculated left pleural effusion with underlying atelectasis/consolidation.  Chest CTA and CT angios aortobifemoral showed no PE or dissection, showed extensive atherosclerosis without significant obstruction, and large left pleural effusion.  Patient was started on IV antibiotics.  Nephrology and vascular surgery were consulted.  Assessment & Plan:   Principal Problem:   AMS (altered mental status) Active Problems:   ESRD (end stage renal disease) (HCC)   Diabetes (HCC)   Pleural effusion   Atherosclerosis   Elevated troponin   Hyperkalemia   Anemia of chronic disease   Severe malnutrition (HCC)  Right foot gangrene -Vascular surgery evaluation appreciated: Do not recommend arteriography and patient is not a candidate for vascular surgery.  Recommend palliative care versus above-knee amputation. -Currently on broad-spectrum antibiotics. -Overall prognosis is guarded to poor.  I had spoken to Dr. Sharol Given on 04/26/2018 and if family wants aggressive intervention with AKA, this can be done earliest on Friday.   -I tried calling patient's son Ronald Lopez on phone on 04/24/2018 but he did not pick up.  Palliative care following and I spoke to [facility] on phone today and palliative care is supposed to have family meeting today.  Because of overall poor prognosis, I recommended patient should be made hospice/comfort measures.  Acute metabolic encephalopathy -Patient is probably confused from metabolic issues including right foot gangrene.  CT head  was negative for acute abnormality.  Still confused.  Monitor mental status.  Fall precautions  End-stage renal disease on dialysis -Patient apparently has been missing hemodialysis for weeks.  Nephrology following.  Dialysis as per nephrology schedule.   Severe protein calorie malnutrition -Nutrition consult  Abdominal pain -Unclear etiology.  Possible gastritis -Continue Protonix.  Left pleural effusion  -Unclear etiology.  Status post ultrasound guided thoracentesis on 04/26/2018 and removal of 700 cc of hazy amber fluid.  Hyperkalemia -Continue Lokelma  Elevated troponin -Likely due to demand ischemia versus renal failure -Troponins did not trend up  Diabetes mellitus type 2 with hypoglycemia -Continue sliding scale insulin.  Hemoglobin A1c 6.  Patient has had episodes of hypoglycemia probably due to poor oral intake.  Continue D5 at 50 cc an hour  Anemia of chronic disease -Hemoglobin stable.  Monitor  Generalized deconditioning -Overall prognosis is very poor.  Recommend comfort measures.  DVT prophylaxis: Heparin Code Status: Full Family Communication: None at bedside Disposition Plan: Depends on clinical outcome  Consultants: Vascular/nephrology/orthopedics, Dr. Dennard Nip care  Procedures: None  Antimicrobials: Zosyn and vancomycin from 04/25/2018 onwards   Subjective: Patient seen and examined at bedside.  Patient is repeat, with troponin elevated but hardly answers any questions.  Looks very fidgety intermittently.  As per nursing staff, patient has been trying to remove heart monitor wires and has been intermittently agitated.  Objective: Vitals:   04/26/18 2100 04/26/18 2207 04/27/18 0502 04/27/18 0544  BP: 115/71 122/76  (!) 75/51  Pulse: 69 72  70  Resp: 20 (!) 21  14  Temp: 98.1 F (36.7 C) (!) 97.4 F (36.3 C)  97.6 F (36.4 C)  TempSrc: Oral Oral Oral  Oral  SpO2: 91%   100%  Weight: 40.4 kg       Intake/Output Summary (Last 24 hours)  at 04/27/2018 3149 Last data filed at 04/26/2018 2100 Gross per 24 hour  Intake 419.51 ml  Output 135 ml  Net 284.51 ml   Filed Weights   04/25/18 2044 04/26/18 1915 04/26/18 2100  Weight: 40.8 kg 40.4 kg 40.4 kg    Examination:  General exam: Appears very thinly built.  Looks chronically ill.  Hardly wakes up on calling his name.  No acute distress.  Looks intermittently fidgety. Respiratory system: Bilateral decreased breath sounds at bases with scattered crackles.  No wheezing Cardiovascular system: Rate controlled, S1-S2 heard. Gastrointestinal system: Abdomen is nondistended, soft and nontender. Normal bowel sounds heard. Extremities: No cyanosis, clubbing; right foot necrotic changes present    Data Reviewed: I have personally reviewed following labs and imaging studies  CBC: Recent Labs  Lab 04/25/18 1532 04/25/18 1607 04/26/18 0339 04/26/18 2350 04/27/18 0305  WBC 5.7  --  5.2 5.2 4.8  NEUTROABS  --   --  3.5  --  3.2  HGB 10.9* 11.9* 9.9* 9.2* 9.1*  HCT 35.3* 35.0* 31.7* 29.3* 29.4*  MCV 89.6  --  87.8 88.5 88.6  PLT 117*  --  98* 77* 74*   Basic Metabolic Panel: Recent Labs  Lab 04/25/18 1532 04/25/18 1607 04/26/18 0339 04/27/18 0305  NA 140 139 137 136  K 5.3* 5.4* 5.2* 5.4*  CL 96*  --  96* 98  CO2 20*  --  21* 19*  GLUCOSE 71  --  64* 68*  BUN 92*  --  92* 85*  CREATININE 14.28*  --  13.58* 12.79*  CALCIUM 8.9  --  8.4* 8.7*  MG  --   --  2.9* 2.7*  PHOS  --   --  9.3*  --    GFR: CrCl cannot be calculated (Unknown ideal weight.). Liver Function Tests: Recent Labs  Lab 04/25/18 1532 04/27/18 0305  AST 16 19  ALT 8 10  ALKPHOS 77 68  BILITOT 0.9 0.8  PROT 8.9* 7.4  ALBUMIN 3.2* 2.6*   Recent Labs  Lab 04/25/18 1532  LIPASE 25   Recent Labs  Lab 04/27/18 0305  AMMONIA 41*   Coagulation Profile: Recent Labs  Lab 04/25/18 1532  INR 1.4*   Cardiac Enzymes: Recent Labs  Lab 04/25/18 1532 04/25/18 1813 04/25/18 1921  04/25/18 2157  TROPONINI 0.10* 0.07* 0.07* 0.06*   BNP (last 3 results) No results for input(s): PROBNP in the last 8760 hours. HbA1C: Recent Labs    04/26/18 0339  HGBA1C 6.0*   CBG: Recent Labs  Lab 04/26/18 1315 04/26/18 1639 04/26/18 2216 04/27/18 0356 04/27/18 0541  GLUCAP 142* 71 72 68* 107*   Lipid Profile: No results for input(s): CHOL, HDL, LDLCALC, TRIG, CHOLHDL, LDLDIRECT in the last 72 hours. Thyroid Function Tests: Recent Labs    04/27/18 0305  TSH 6.356*   Anemia Panel: Recent Labs    04/27/18 0305  VITAMINB12 1,227*  FOLATE 25.1   Sepsis Labs: Recent Labs  Lab 04/25/18 1532  LATICACIDVEN 1.6    Recent Results (from the past 240 hour(s))  Blood culture (routine x 2)     Status: None (Preliminary result)   Collection Time: 04/25/18  6:00 PM  Result Value Ref Range Status   Specimen Description BLOOD LEFT ANTECUBITAL  Final   Special Requests   Final    BOTTLES DRAWN AEROBIC AND  ANAEROBIC Blood Culture adequate volume   Culture   Final    NO GROWTH < 24 HOURS Performed at [facility], [place] [address] [place], [place] [address]    Report Status PENDING  Incomplete  Blood culture (routine x 2)     Status: None (Preliminary result)   Collection Time: 04/25/18  6:12 PM  Result Value Ref Range Status   Specimen Description BLOOD LEFT FOREARM  Final   Special Requests   Final    BOTTLES DRAWN AEROBIC ONLY Blood Culture results may not be optimal due to an inadequate volume of blood received in culture bottles   Culture   Final    NO GROWTH < 24 HOURS Performed at [facility], [place] [address], [place], [place] [address]    Report Status PENDING  Incomplete  Gram stain     Status: None   Collection Time: 04/26/18  3:29 PM  Result Value Ref Range Status   Specimen Description PLEURAL LEFT  Final   Special Requests NONE  Final   Gram Stain   Final    ABUNDANT WBC PRESENT, PREDOMINANTLY MONONUCLEAR NO ORGANISMS SEEN Performed at  [facility], [address], [place], [place] [address]    Report Status 04/26/2018 FINAL  Final         Radiology Studies: Dg Chest 1 View  Result Date: 04/26/2018 CLINICAL DATA:  Status post left thoracentesis. EXAM: CHEST  1 VIEW COMPARISON:  Radiograph of April 25, 2018. FINDINGS: Stable cardiomegaly. Atherosclerosis of thoracic aorta is noted. Grossly stable left pleural effusion is noted with associated atelectasis or infiltrate. No pneumothorax is noted. Right lung is hyperexpanded, but otherwise normal. Bony thorax unremarkable. IMPRESSION: No pneumothorax status post left thoracentesis. Aortic Atherosclerosis (ICD10-I70.0). Electronically Signed   By: Marijo Conception M.D.   On: 04/26/2018 15:44   Ct Head Wo Contrast  Result Date: 04/25/2018 CLINICAL DATA:  Altered level of consciousness EXAM: CT HEAD WITHOUT CONTRAST TECHNIQUE: Contiguous axial images were obtained from the base of the skull through the vertex without intravenous contrast. COMPARISON:  None. FINDINGS: Brain: No evidence of acute infarction, hemorrhage, extra-axial collection, ventriculomegaly, or mass effect. Generalized cerebral atrophy. Periventricular white matter low attenuation likely secondary to microangiopathy. Vascular: Cerebrovascular atherosclerotic calcifications are noted. Skull: Negative for fracture or focal lesion. Sinuses/Orbits: Visualized portions of the orbits are unremarkable. Mastoid sinuses are clear. Right maxillary sinus mucosal thickening. Other: None. IMPRESSION: No acute intracranial pathology. Electronically Signed   By: Kathreen Devoid   On: 04/25/2018 16:59   Ct Angio Aortobifemoral W And/or Wo Contrast  Result Date: 04/25/2018 CLINICAL DATA:  73 year old male with a history of right foot wound and concern for chronic limb threatening ischemia EXAM: CT ANGIOGRAPHY OF CHEST ABDOMINAL AORTA WITH ILIOFEMORAL RUNOFF TECHNIQUE: Multidetector CT imaging of the chest, abdomen, pelvis and lower  extremities was performed using the standard protocol during bolus administration of intravenous contrast, following multi detector spiral CT imaging of the chest without contrast. Multiplanar CT image reconstructions and MIPs were obtained to evaluate the vascular anatomy. CONTRAST:  1102mL OMNIPAQUE IOHEXOL 350 MG/ML SOLN COMPARISON:  None. FINDINGS: Chest: Cardiovascular: Heart: Cardiomegaly. No pericardial fluid/thickening. Differential attenuation of the blood pool and the myocardium on the precontrast images. Dense calcifications of left main, left anterior descending, circumflex, right coronary arteries. Aorta: No aneurysm or dissection. Atherosclerotic changes of the thoracic aorta. Pulmonary arteries: No central, lobar, segmental, or proximal subsegmental filling defects. Mediastinum/Nodes: No significant mediastinal  adenopathy, with the sensitivity decreased given the patient's body habitus and timing of the contrast bolus. Unremarkable appearance of the thoracic esophagus. Unremarkable thoracic inlet. Lungs/Pleura: Central airways are clear. Right lung clear with no confluent airspace disease, pneumothorax or pleural effusion. Right-sided pleural effusion with near complete atelectasis of the right lower lobe. Apicoposterior segment of the left upper lobe is aerated. Near complete collapse of the lingula. No pneumothorax. Musculoskeletal: No acute displaced fracture. Degenerative changes of the spine. Review of the MIP images confirms the above findings. Abdomen/pelvis: VASCULAR Aorta: Atherosclerotic changes of the abdominal aorta. No aneurysm or dissection. No definite evidence of periaortic fluid. Celiac: Atherosclerotic changes at the celiac artery origin. Branches are patent. There is preferential calcification of distal branches/small vessels of the splenic artery. SMA: SMA patent with mild atherosclerotic changes at the origin. Preferential calcification of the distal branches of the SMA  distribution. Renals: Mixed calcified and soft plaque at the origin of the right renal artery with at least 50% stenosis. Dense calcifications of the distal right renal arterial distribution. On the left there is atherosclerotic changes at the origin of the renal artery. Symmetric to the right there is dense calcifications of the small vessels of the left renal artery distribution. IMA: IMA remains patent with dense calcifications at the origin. Right lower extremity: Advanced atherosclerosis of the right iliac system with no high-grade stenosis or occlusion. No aneurysm or dissection. Hypogastric artery is patent with advanced atherosclerosis of the pelvic arteries. Common femoral artery patent with mild atherosclerotic changes. Profunda femoris patent with patent thigh branches. Dense calcifications. There is circumferential calcifications of nearly the entire native SFA from the origin through the adductor canal and the popliteal artery. Degree of calcification limits the sensitivity for luminal evaluation, however, the SFA appears patent. Beyond the adductor canal, the study is nondiagnostic. There is nearly complete circumferential calcification of all 3 tibial arteries to the ankle yielding the CT a nondiagnostic. Left lower extremity: Advanced atherosclerosis of the left iliac system with no high-grade stenosis or occlusion identified. No aneurysm or dissection. Hypogastric artery patent with advanced atherosclerosis of the pelvic arteries. Common femoral artery with mild atherosclerotic changes. Profunda femoris is patent as well as the thigh branches. Advanced circumferential atherosclerotic calcifications of the thigh branches. There is circumferential calcification of nearly the entire length of the left SFA. The degree of calcification limits the sensitivity for the evaluation of luminal narrowing, however, the SFA appears patent to the popliteal artery. Beyond the knee, the study is nondiagnostic. There  is nearly complete circumferential calcification of all 3 tibial vessels to the ankle, yielding the CTa nondiagnostic. Veins: Unremarkable appearance of the venous system. Review of the MIP images confirms the above findings. NON-VASCULAR The timing of the contrast bolus limits the evaluation of the abdomen and pelvis given the patient's lean body habitus. Hepatobiliary: Relatively unremarkable liver. Calcified stones within the gallbladder. Pancreas: Pancreas not well evaluated. Spleen: Unremarkable Adrenals/Urinary Tract: Adrenal glands not well visualized. Right: Atrophic right kidney with no hydronephrosis. Left: Atrophic left kidney with no hydronephrosis. Urinary bladder demonstrates calcified stones at the dependent aspect. Circumferential wall thickening. Stomach/Bowel: Relatively unremarkable stomach. Stomach wall not well visualized. No abnormally distended small bowel though the bowel wall not well evaluated. No evidence of obstruction. Appendix is not visualized, however, no inflammatory changes are present adjacent to the cecum to indicate an appendicitis. Colon is decompressed. Colon wall not well evaluated. No significant stool burden or evidence of obstruction. Lymphatic: No significant adenopathy. Mesenteric:  No free fluid or air. No adenopathy. Reproductive: Prostatomegaly, with estimated prostate diameter 4.9 cm Other: Cachectic abdominal wall.  No hernia. Musculoskeletal: Advanced degenerative changes of the lumbar spine. Worst degree of degenerative changes at the level of L3-L4 and L4-L5. Vacuum disc phenomenon spans all levels of the lumbar spine. Posterior disc osteophyte of L3-L4 narrows the canal by 50%. No acute displaced fracture. IMPRESSION: No CT evidence of acute aortic syndrome. Multilevel atherosclerosis (diffuse Monckeberg's pattern), including -aortic atherosclerosis with no occlusion or aneurysm. Aortic Atherosclerosis (ICD10-I70.0). -bilateral iliac atherosclerosis with no  high-grade stenosis or occlusion -bilateral femoral popliteal atherosclerosis. The degree of circumferential, long segment calcification limits the sensitivity for the assessment of high-grade stenosis of the femoral popliteal segment, however, the bilateral SFA and popliteal artery appear patent to the knees -advanced bilateral tibial disease. Beyond the knees, the study is essentially nondiagnostic given the circumferential long segment calcification of the tibial arteries, form origin to ankles. Correlation with noninvasive testing is indicated. 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 There may be developing stenosis at the origin of the IMA. 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 Advanced coronary arterial disease. Left-sided pleural effusion with partial collapse of the left upper lobe and left lower lobe. Follow-up CT imaging is indicated once the patient has been treated to assure resolution of the atelectasis and rule out the possibility of persisting nodule/mass. Trace ascites, uncertain origin. The abdomen is not well evaluated given the timing of the contrast bolus and the patient's body habitus. If there is concern for acute abdominal process (other than a vascular process) standard contrast-enhanced CT imaging should be considered. Prostatomegaly, which may account for the circumferential bladder wall thickening. Correlation with any history of bladder outlet obstruction is suggested. There are small calcifications within the lumen of the urinary bladder, potentially retained small stones. Signed, Dulcy Fanny. Dellia Nims, RPVI [facility] Electronically Signed   By: Corrie Mckusick D.O.   On: 04/25/2018 17:26   Dg Chest Port 1 View  Result Date: 04/25/2018 CLINICAL DATA:  Sudden onset abdominal and back pain during dialysis. EXAM: PORTABLE CHEST 1 VIEW COMPARISON:  None. FINDINGS: Cardiomegaly. The left heart  border is obscured. No mediastinal shift. Atherosclerotic calcification of the aortic arch. Normal pulmonary vascularity. Large, loculated left pleural effusion with underlying atelectasis/consolidation. The right lung is clear. No pneumothorax. No acute osseous abnormality. IMPRESSION: 1. Large, loculated left pleural effusion with underlying atelectasis/consolidation. 2.  Aortic atherosclerosis (ICD10-I70.0). Electronically Signed   By: Titus Dubin M.D.   On: 04/25/2018 15:59   Ct Angio Chest Aorta W And/or Wo Contrast  Result Date: 04/25/2018 CLINICAL DATA:  73 year old male with a history of right foot wound and concern for chronic limb threatening ischemia EXAM: CT ANGIOGRAPHY OF CHEST ABDOMINAL AORTA WITH ILIOFEMORAL RUNOFF TECHNIQUE: Multidetector CT imaging of the chest, abdomen, pelvis and lower extremities was performed using the standard protocol during bolus administration of intravenous contrast, following multi detector spiral CT imaging of the chest without contrast. Multiplanar CT image reconstructions and MIPs were obtained to evaluate the vascular anatomy. CONTRAST:  168mL OMNIPAQUE IOHEXOL 350 MG/ML SOLN COMPARISON:  None. FINDINGS: Chest: Cardiovascular: Heart: Cardiomegaly. No pericardial fluid/thickening. Differential attenuation of the blood pool and the myocardium on the precontrast images. Dense calcifications of left main, left anterior descending, circumflex, right coronary arteries. Aorta: No aneurysm or dissection. Atherosclerotic changes of the thoracic aorta. Pulmonary arteries: No central, lobar, segmental, or proximal subsegmental filling defects. Mediastinum/Nodes:  No significant mediastinal adenopathy, with the sensitivity decreased given the patient's body habitus and timing of the contrast bolus. Unremarkable appearance of the thoracic esophagus. Unremarkable thoracic inlet. Lungs/Pleura: Central airways are clear. Right lung clear with no confluent airspace disease,  pneumothorax or pleural effusion. Right-sided pleural effusion with near complete atelectasis of the right lower lobe. Apicoposterior segment of the left upper lobe is aerated. Near complete collapse of the lingula. No pneumothorax. Musculoskeletal: No acute displaced fracture. Degenerative changes of the spine. Review of the MIP images confirms the above findings. Abdomen/pelvis: VASCULAR Aorta: Atherosclerotic changes of the abdominal aorta. No aneurysm or dissection. No definite evidence of periaortic fluid. Celiac: Atherosclerotic changes at the celiac artery origin. Branches are patent. There is preferential calcification of distal branches/small vessels of the splenic artery. SMA: SMA patent with mild atherosclerotic changes at the origin. Preferential calcification of the distal branches of the SMA distribution. Renals: Mixed calcified and soft plaque at the origin of the right renal artery with at least 50% stenosis. Dense calcifications of the distal right renal arterial distribution. On the left there is atherosclerotic changes at the origin of the renal artery. Symmetric to the right there is dense calcifications of the small vessels of the left renal artery distribution. IMA: IMA remains patent with dense calcifications at the origin. Right lower extremity: Advanced atherosclerosis of the right iliac system with no high-grade stenosis or occlusion. No aneurysm or dissection. Hypogastric artery is patent with advanced atherosclerosis of the pelvic arteries. Common femoral artery patent with mild atherosclerotic changes. Profunda femoris patent with patent thigh branches. Dense calcifications. There is circumferential calcifications of nearly the entire native SFA from the origin through the adductor canal and the popliteal artery. Degree of calcification limits the sensitivity for luminal evaluation, however, the SFA appears patent. Beyond the adductor canal, the study is nondiagnostic. There is nearly  complete circumferential calcification of all 3 tibial arteries to the ankle yielding the CT a nondiagnostic. Left lower extremity: Advanced atherosclerosis of the left iliac system with no high-grade stenosis or occlusion identified. No aneurysm or dissection. Hypogastric artery patent with advanced atherosclerosis of the pelvic arteries. Common femoral artery with mild atherosclerotic changes. Profunda femoris is patent as well as the thigh branches. Advanced circumferential atherosclerotic calcifications of the thigh branches. There is circumferential calcification of nearly the entire length of the left SFA. The degree of calcification limits the sensitivity for the evaluation of luminal narrowing, however, the SFA appears patent to the popliteal artery. Beyond the knee, the study is nondiagnostic. There is nearly complete circumferential calcification of all 3 tibial vessels to the ankle, yielding the CTa nondiagnostic. Veins: Unremarkable appearance of the venous system. Review of the MIP images confirms the above findings. NON-VASCULAR The timing of the contrast bolus limits the evaluation of the abdomen and pelvis given the patient's lean body habitus. Hepatobiliary: Relatively unremarkable liver. Calcified stones within the gallbladder. Pancreas: Pancreas not well evaluated. Spleen: Unremarkable Adrenals/Urinary Tract: Adrenal glands not well visualized. Right: Atrophic right kidney with no hydronephrosis. Left: Atrophic left kidney with no hydronephrosis. Urinary bladder demonstrates calcified stones at the dependent aspect. Circumferential wall thickening. Stomach/Bowel: Relatively unremarkable stomach. Stomach wall not well visualized. No abnormally distended small bowel though the bowel wall not well evaluated. No evidence of obstruction. Appendix is not visualized, however, no inflammatory changes are present adjacent to the cecum to indicate an appendicitis. Colon is decompressed. Colon wall not well  evaluated. No significant stool burden or evidence of obstruction. Lymphatic:  No significant adenopathy. Mesenteric: No free fluid or air. No adenopathy. Reproductive: Prostatomegaly, with estimated prostate diameter 4.9 cm Other: Cachectic abdominal wall.  No hernia. Musculoskeletal: Advanced degenerative changes of the lumbar spine. Worst degree of degenerative changes at the level of L3-L4 and L4-L5. Vacuum disc phenomenon spans all levels of the lumbar spine. Posterior disc osteophyte of L3-L4 narrows the canal by 50%. No acute displaced fracture. IMPRESSION: No CT evidence of acute aortic syndrome. Multilevel atherosclerosis (diffuse Monckeberg's pattern), including -aortic atherosclerosis with no occlusion or aneurysm. Aortic Atherosclerosis (ICD10-I70.0). -bilateral iliac atherosclerosis with no high-grade stenosis or occlusion -bilateral femoral popliteal atherosclerosis. The degree of circumferential, long segment calcification limits the sensitivity for the assessment of high-grade stenosis of the femoral popliteal segment, however, the bilateral SFA and popliteal artery appear patent to the knees -advanced bilateral tibial disease. Beyond the knees, the study is essentially nondiagnostic given the circumferential long segment calcification of the tibial arteries, form origin to ankles. Correlation with noninvasive testing is indicated. 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 There may be developing stenosis at the origin of the IMA. 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 Advanced coronary arterial disease. Left-sided pleural effusion with partial collapse of the left upper lobe and left lower lobe. Follow-up CT imaging is indicated once the patient has been treated to assure resolution of the atelectasis and rule out the possibility of persisting nodule/mass. Trace ascites, uncertain origin. The abdomen is not well evaluated given the timing of the contrast bolus and the  patient's body habitus. If there is concern for acute abdominal process (other than a vascular process) standard contrast-enhanced CT imaging should be considered. Prostatomegaly, which may account for the circumferential bladder wall thickening. Correlation with any history of bladder outlet obstruction is suggested. There are small calcifications within the lumen of the urinary bladder, potentially retained small stones. Signed, Dulcy Fanny. Dellia Nims, RPVI [facility] Radiology Electronically Signed   By: Corrie Mckusick D.O.   On: 04/25/2018 17:26   Ir Thoracentesis Asp Pleural Space W/img Guide  Result Date: 04/26/2018 INDICATION: Patient with history of end-stage renal disease, left pleural effusion, gangrenous right foot. Request made for diagnostic and therapeutic left thoracentesis. EXAM: ULTRASOUND GUIDED DIAGNOSTIC AND THERAPEUTIC LEFT THORACENTESIS MEDICATIONS: None COMPLICATIONS: None immediate. PROCEDURE: An ultrasound guided thoracentesis was thoroughly discussed with the patient and questions answered. The benefits, risks, alternatives and complications were also discussed. The patient understands and wishes to proceed with the procedure. Written consent was obtained. Ultrasound was performed to localize and mark an adequate pocket of fluid in the left chest. The area was then prepped and draped in the normal sterile fashion. 1% Lidocaine was used for local anesthesia. Under ultrasound guidance a 6 Fr Safe-T-Centesis catheter was introduced. Thoracentesis was performed. The catheter was removed and a dressing applied. FINDINGS: A total of approximately 720 cc of slightly hazy, amber fluid was removed. Samples were sent to the laboratory as requested by the clinical team. Due to patient chest discomfort only the above amount of fluid was removed today. IMPRESSION: Successful ultrasound guided diagnostic and therapeutic left thoracentesis yielding 720 cc  of pleural fluid. Read by: Rowe Robert, PA-C Electronically Signed   By: Aletta Edouard M.D.   On: 04/26/2018 15:33        Scheduled Meds:  acidophilus  1 capsule Oral Daily   atorvastatin  20 mg Oral Daily   Chlorhexidine Gluconate Cloth  6 each Topical Q0600   Chlorhexidine  Gluconate Cloth  6 each Topical Q0600   feeding supplement (ENSURE ENLIVE)  237 mL Oral BID BM   haloperidol lactate  2.5 mg Intravenous Once   heparin  2,000 Units Dialysis Once in dialysis   heparin  5,000 Units Subcutaneous Q8H   insulin aspart  0-9 Units Subcutaneous TID WC   multivitamin  1 tablet Oral QHS   pantoprazole (PROTONIX) IV  40 mg Intravenous Q24H   sevelamer carbonate  1,600 mg Oral TID WC   sodium zirconium cyclosilicate  5 g Oral Once   vancomycin  500 mg Intravenous Q T,Th,Sa-HD   Continuous Infusions:  sodium chloride     sodium chloride     dextrose 50 mL/hr at 04/26/18 1108   piperacillin-tazobactam (ZOSYN)  IV 3.375 g (04/26/18 2358)   vancomycin       LOS: 2 days        Aline August, MD [facility] 04/27/2018, 9:38 AM

## 2018-04-27 NOTE — TOC Transition Note (Addendum)
Transition of Care [facility]) - CM/SW Discharge Note Marvetta Gibbons RN, BSN Transitions of Care Unit 4E- RN Case Manager [phone]  Patient Details  Name: Ronald Lopez MRN: [id number] Date of Birth: May 18, 1945  Transition of Care [facility]) CM/SW Contact:  Dawayne Patricia, RN Phone Number: 04/27/2018, 2:09 PM   Clinical Narrative:    Expected Discharge Plan and Services Expected Discharge Plan: Home w Hospice Care In-house Referral: Hospice / Palliative Care Discharge Planning Services: CM Consult Post Acute Care Choice: Hospice Living arrangements for the past 2 months: Single Family Home Expected Discharge Date: 04/27/18               DME Arranged: [place] [facility]   Date DME Agency Contacted: 04/27/18 Time DME Agency Contacted: Camillo Flaming to contact for DME) Representative spoke with at DME Agency: (pt going home with Hospice- DME arranged via [place]) [place] Arranged: Disease Management [place] Agency: Hospice and [place] Date [place]: 04/27/18 Time [place]: 1118 Representative spoke with at [place]: Venia Carbon    Final next level of care: Home w Hospice Care Barriers to Discharge: No Barriers Identified   Patient Goals and CMS Choice Patient states their goals for this hospitalization and ongoing recovery are:: "comfort care" CMS Medicare.gov Compare Post Acute Care list provided to:: Patient Represenative (must comment) Choice offered to / list presented to : Adult Children  Discharge Placement  Pt to transition home with hospice via [place] has been called for transport, paperwork placed on shadowchart along with signed GOLD DNR form. Bedside RN aware that transport has been called, also spoke with son Mallie Mussel over phone. DC summary faxed to [facility] via epic.              1430- notified by Venia Carbon with [facility]- pt has been approved for Hospice        Discharge Plan and  Services In-house Referral: Hospice / Palliative Care Discharge Planning Services: CM Consult Post Acute Care Choice: Hospice          DME Arranged: Hospice Equipment Package A   Date DME Agency Contacted: 04/27/18 Time DME Agency Contacted: Camillo Flaming to contact for DME) Representative spoke with at DME Agency: (pt going home with Hospice- DME arranged via [facility]) [place] Arranged: Disease Management [place] Agency: Hospice and [place] Date [facility] Agency Contacted: 04/27/18 Time [place]: 1118 Representative spoke with at [place]: [place] (SDOH) Interventions     Readmission Risk Interventions Readmission Risk Prevention Plan 04/27/2018  Transportation Screening Complete  PCP or Specialist Appt within 3-5 Days Complete  HRI or [place] Complete  Social Work Consult for [place] Planning/Counseling Not Complete  SW consult not completed comments pt going home with hospice  Palliative Care Screening Complete  Medication Review Press photographer) Complete

## 2018-04-27 NOTE — Progress Notes (Signed)
Dialysis Rn called report and notified us that the Treatement has to be stopped because pt access was infiltrated. RN also reported pt was not  Able to follow commands.

## 2018-04-27 NOTE — Evaluation (Signed)
Occupational Therapy Evaluation Patient Details Name: Ronald Lopez MRN: [id number] DOB: [birth year] Today's Date: 04/27/2018    History of Present Illness 73 year old male with history of hypertension, hyperlipidemia, diabetes mellitus, end-stage renal disease on hemodialysis presented on 04/25/2018 with abdominal pain and confusion.  He was found to have right foot necrotic changes.  Per chart, may need AKA right LE.  Chest x-ray showed a large, loculated left pleural effusion with underlying atelectasis/consolidation.  Chest CTA and CT angios aortobifemoral showed no PE or dissection, showed extensive atherosclerosis without significant obstruction, and large left pleural effusion.  Patient was started on IV antibiotics.   Clinical Impression   PTA Pt was able to assist with some basic grooming, but was largely dependent on family for ADL, he required min to mod A for transfers which he declined this session. He is blind, HOH and spanish speaking which made communicating throughout the session difficult. He was agreeable to sit EOB and participate in minimal grooming tasks with min A. He is at baseline for ADL and his son would like him to come home. Since he is at baseline OT will sign off at this time. If the medical team does decide to go with aggressive treatment and he undergoes amputation, please feel free to consult [place] again. Thank you for the opportunity to serve this patient.    Follow Up Recommendations  Supervision/Assistance - 24 hour    Equipment Recommendations  None recommended by OT    Recommendations for Other Services Other (comment)(Palliative)     Precautions / Restrictions Precautions Precautions: Fall Precaution Comments: spanish speaking, blind and HOH Restrictions Weight Bearing Restrictions: No      Mobility Bed Mobility Overal bed mobility: Needs Assistance Bed Mobility: Rolling;Sidelying to Sit Rolling: Min guard Sidelying to sit: +2 for physical  assistance;Min guard       General bed mobility comments: Pt min guard for all aspects of bed mobility today  Transfers                 General transfer comment: NT this session    Balance Overall balance assessment: Needs assistance Sitting-balance support: No upper extremity supported;Feet supported Sitting balance-Leahy Scale: Fair Sitting balance - Comments: Pt able to sit EOB for 5 min.  Constantly scratching body.  Nurse is aware.                                    ADL either performed or assessed with clinical judgement   ADL Overall ADL's : At baseline                                       General ADL Comments: Pt is dependent at baseline for ADL on family. They help him with all aspects of ADL     Vision Baseline Vision/History: Legally blind       Perception     Praxis      Pertinent Vitals/Pain Pain Assessment: No/denies pain(itching only)     Hand Dominance     Extremity/Trunk Assessment Upper Extremity Assessment Upper Extremity Assessment: Generalized weakness   Lower Extremity Assessment Lower Extremity Assessment: Defer to PT evaluation   Cervical / Trunk Assessment Cervical / Trunk Assessment: Kyphotic   Communication Communication Communication: Prefers language other than English(Spanish speaking interpreter Eyvonne Mechanic [phone])   Cognition Arousal/Alertness: Lethargic Behavior  During Therapy: Restless;Impulsive Overall Cognitive Status: Impaired/Different from baseline Area of Impairment: Following commands;Safety/judgement;Awareness;Problem solving                       Following Commands: Follows one step commands inconsistently;Follows one step commands with increased time Safety/Judgement: Decreased awareness of safety;Decreased awareness of deficits   Problem Solving: Difficulty sequencing;Requires tactile cues;Requires verbal cues;Decreased initiation General Comments: Pt very HOH,  trouble hearing the interpreter on ipad, aware he's in the hospital, perseverating on the fact that he's itchy   General Comments  Per RN patient does better with in person [place] interpreter. She was in a meeting with palliative and his family at the time of session    Exercises     Shoulder Instructions      Home Living Family/patient expects to be discharged to:: Private residence Living Arrangements: Spouse/significant other;Children Available Help at Discharge: Family;Available 24 hours/day Type of Home: Apartment Home Access: Level entry     Home Layout: One level     Bathroom Shower/Tub: Teacher, early years/pre: Standard     Home Equipment: Wheelchair - manual;Bedside commode          Prior Functioning/Environment Level of Independence: Needs assistance  Gait / Transfers Assistance Needed: squat pivot transfer to wheelchair with min to mod assist since 2018 per son, wobbles on feet per son ADL's / Homemaking Assistance Needed: Pt fed himself, needed assist with grooming and bathing, son woudl physically put him in bathtub; pt wore Depends because he didnt like using the 3n1.     Comments: PT Called son and obtained all information above        OT Problem List: Impaired balance (sitting and/or standing);Decreased cognition      OT Treatment/Interventions:      OT Goals(Current goals can be found in the care plan section) Acute Rehab OT Goals Patient Stated Goal: son wants pt to come home OT Goal Formulation: With family(with PT) Time For Goal Achievement: 05/11/18 Potential to Achieve Goals: Fair  OT Frequency:     Barriers to D/C:            Co-evaluation              AM-PAC OT "6 Clicks" Daily Activity     Outcome Measure Help from another person eating meals?: A Lot Help from another person taking care of personal grooming?: A Lot Help from another person toileting, which includes using toliet, bedpan, or urinal?: Total Help  from another person bathing (including washing, rinsing, drying)?: A Lot Help from another person to put on and taking off regular upper body clothing?: A Lot Help from another person to put on and taking off regular lower body clothing?: A Lot 6 Click Score: 11   End of Session Nurse Communication: Mobility status(itching)  Activity Tolerance: Treatment limited secondary to agitation Patient left: in bed;with call bell/phone within reach;with bed alarm set;Other (comment)(all 4 rails up)  OT Visit Diagnosis: Muscle weakness (generalized) (M62.81);Adult, failure to thrive (R62.7);Other symptoms and signs involving cognitive function                Time: 5188-4166 OT Time Calculation (min): 23 min Charges:  OT General Charges $OT Visit: 1 Visit OT Evaluation $OT Eval Moderate Complexity: 1 Mod OT Treatments $Self Care/Home Management : 8-22 mins  Hulda Humphrey OTR/L Acute Rehabilitation Services Pager: [phone] Office: [place] 04/27/2018, 11:24 AM

## 2018-04-27 NOTE — Progress Notes (Signed)
Pt restless and removing heart monitor wires . PT also itching and scratching body all over we'll notified MD

## 2018-04-30 LAB — CULTURE, BLOOD (ROUTINE X 2)
Culture: NO GROWTH
Culture: NO GROWTH
Special Requests: ADEQUATE

## 2018-05-01 LAB — CULTURE, BODY FLUID W GRAM STAIN -BOTTLE: Culture: NO GROWTH

## 2018-05-05 DEATH — deceased

## 2018-06-08 LAB — ACID FAST CULTURE WITH REFLEXED SENSITIVITIES (MYCOBACTERIA): Acid Fast Culture: NEGATIVE

## 2020-08-21 IMAGING — CT CT HEAD WITHOUT CONTRAST
4 series · 17 of 47 positions shown, 19 images · non-contrast
Comparison: None.

CLINICAL DATA: Altered level of consciousness

EXAM:
CT HEAD WITHOUT CONTRAST
TECHNIQUE: Contiguous axial images were obtained from the base of the skull
through the vertex without intravenous contrast.

[Series 4: head without · axial · non-contrast · 0.46mm/px · z∈[-33,+87]mm · 7 of 33 slices shown, 9 images]
[im 5/33  brain]
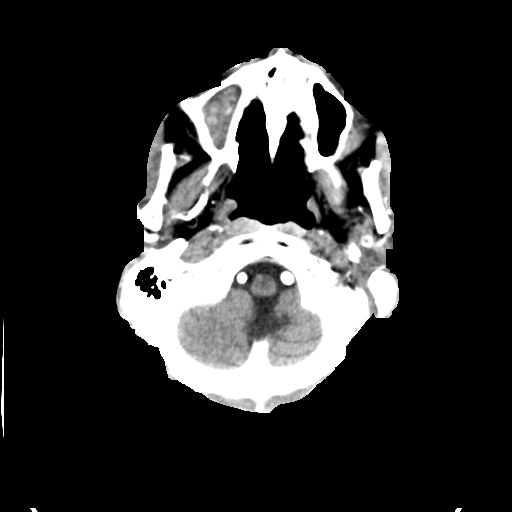
[im 5/33  bone]
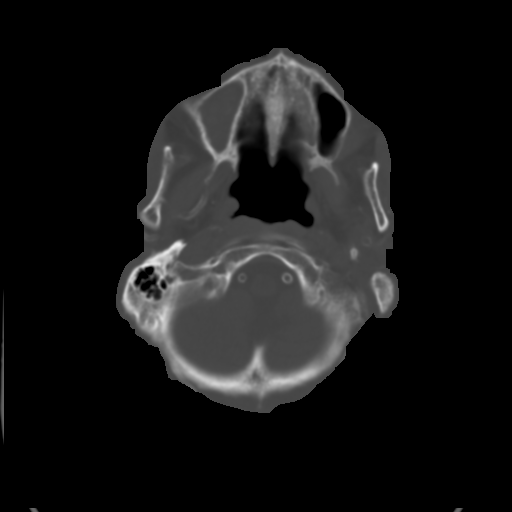
[im 9/33  brain]
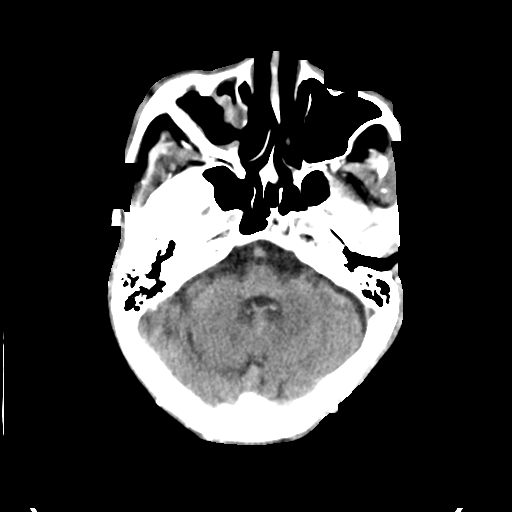
[im 13/33  brain]
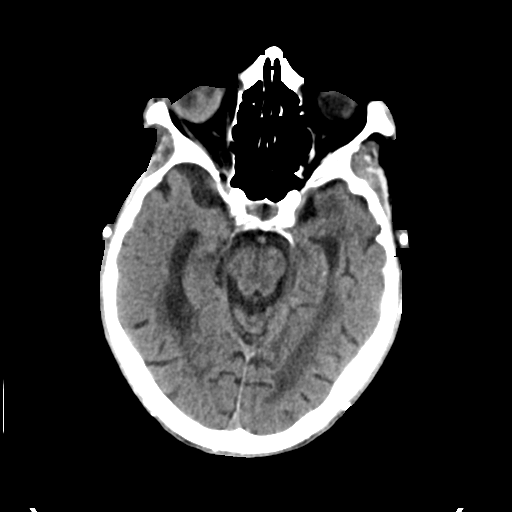
[im 17/33  brain]
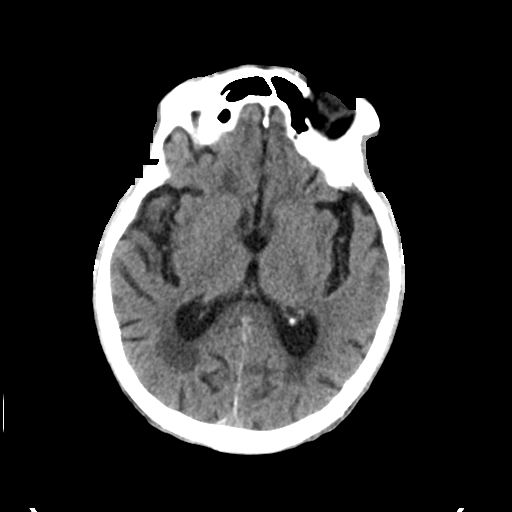
[im 21/33  brain]
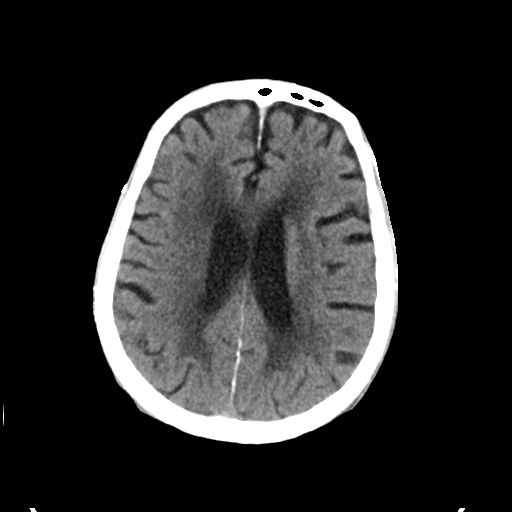
[im 21/33  bone]
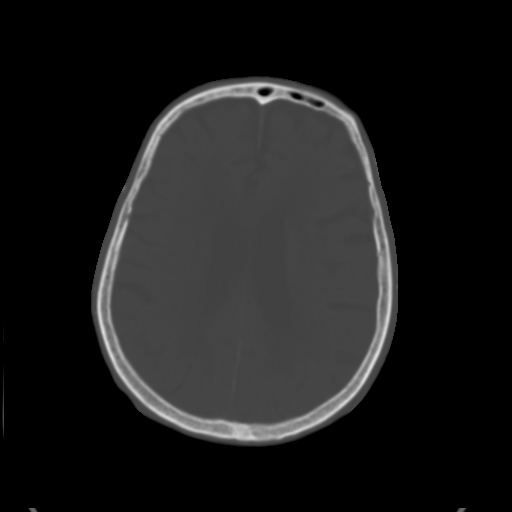
[im 25/33  brain]
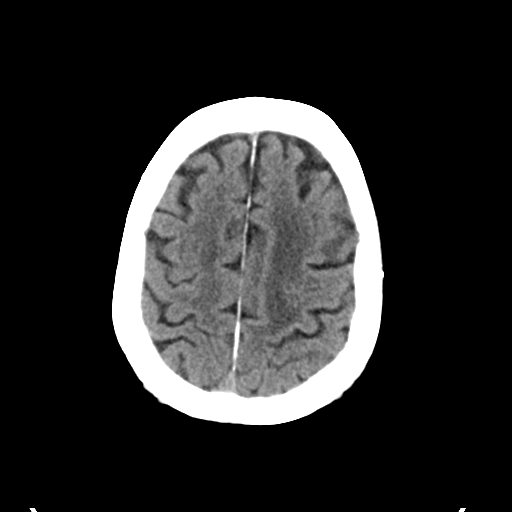
[im 29/33  brain]
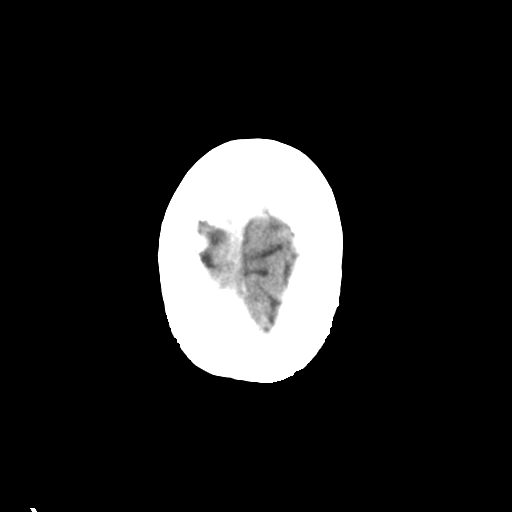

[Series 5: head bone · axial · 0.46mm/px · z∈[-37,+19]mm · 4 of 81 slices shown]
[im 9/81  bone]
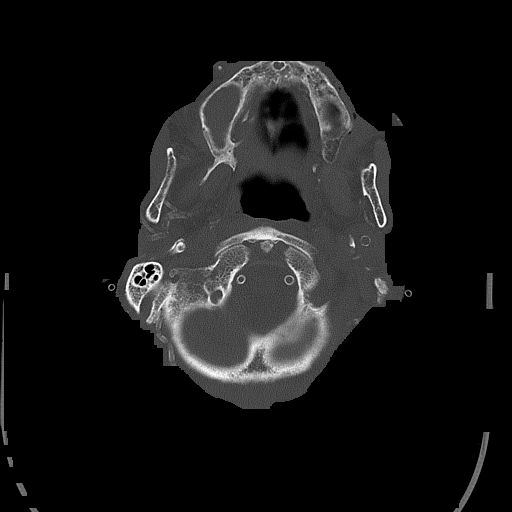
[im 17/81  bone]
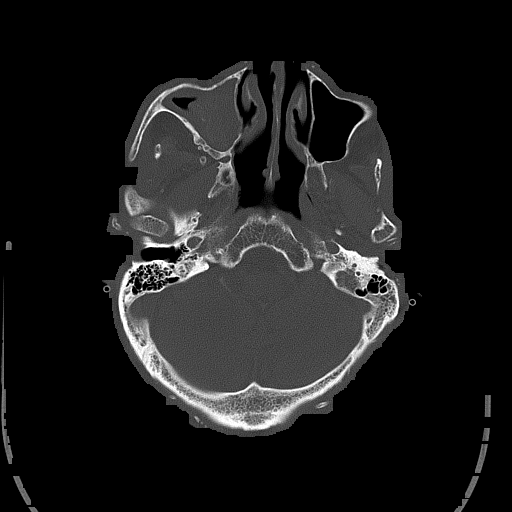
[im 25/81  bone]
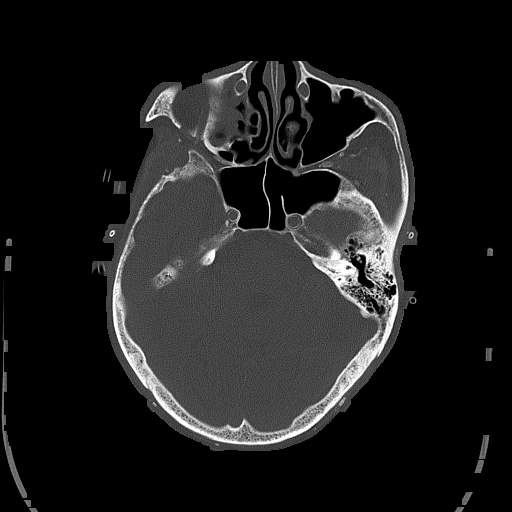
[im 37/81  bone]
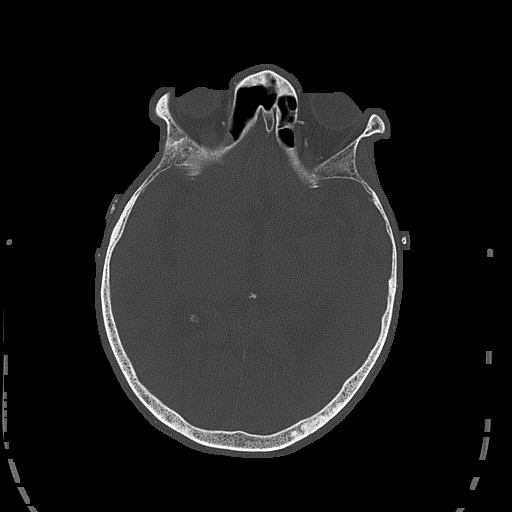

[Series 6: head without cor · coronal · non-contrast · 0.31mm/px · 3 of 67 slices shown]
[im 23/67  brain]
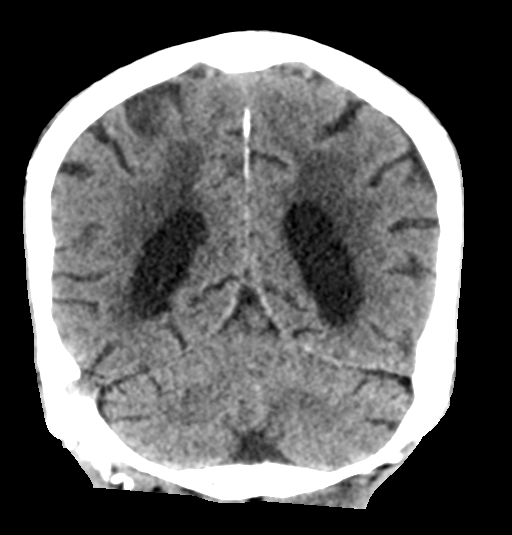
[im 30/67  brain]
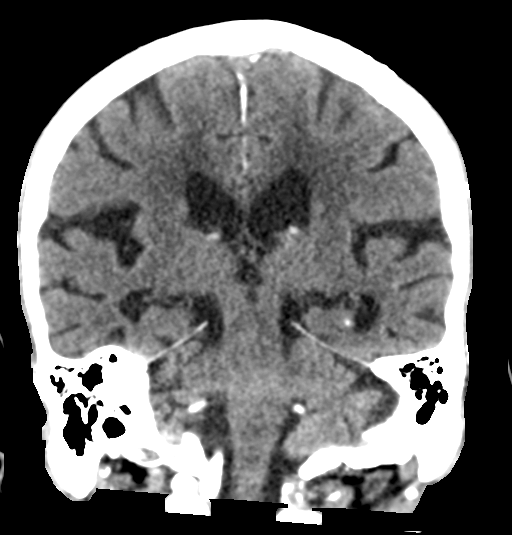
[im 37/67  brain]
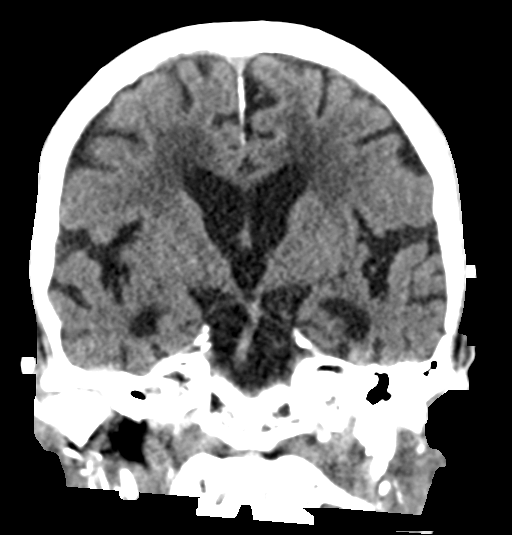

[Series 7: head without sag · sagittal · non-contrast · 0.31mm/px · 3 of 49 slices shown]
[im 17/49  brain]
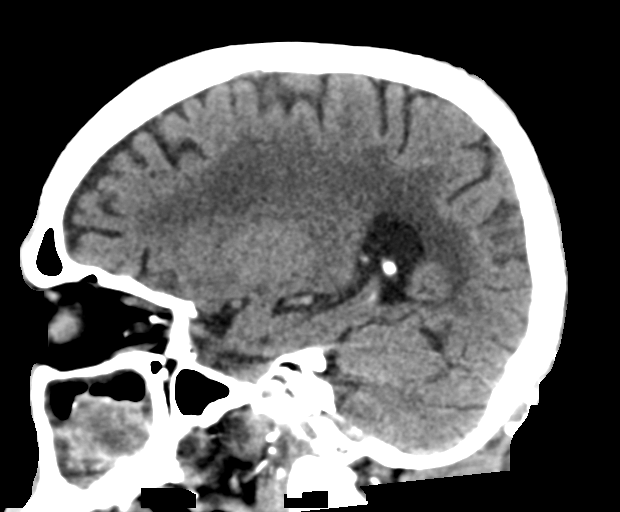
[im 25/49  brain]
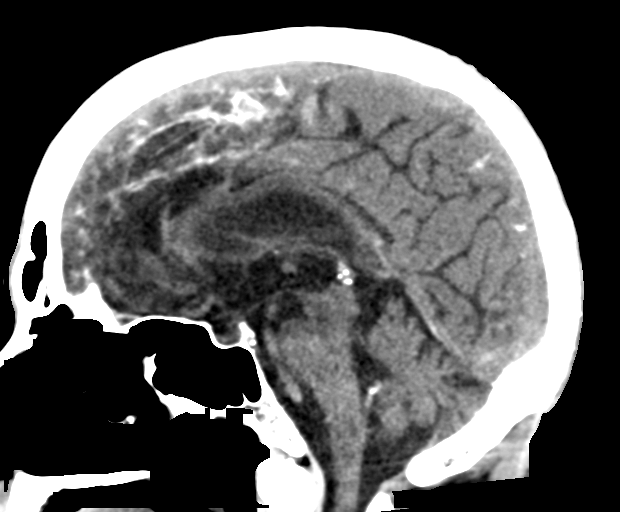
[im 33/49  brain]
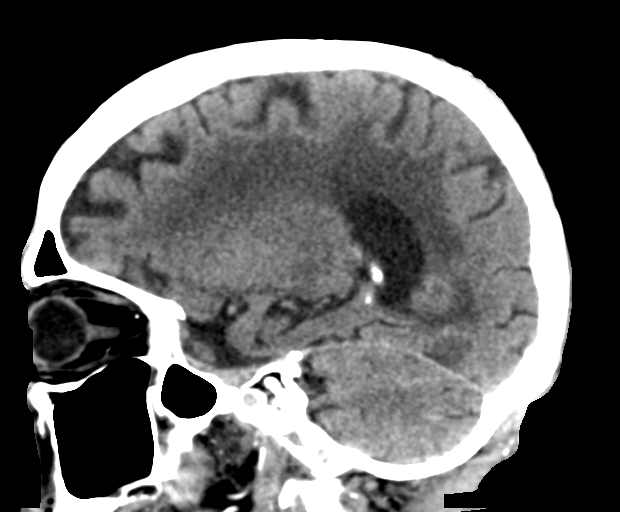

[17 of 47 positions shown; findings below may reference images not displayed]

FINDINGS: Brain: No evidence of acute infarction, hemorrhage, extra-axial
collection, ventriculomegaly, or mass effect. Generalized cerebral
atrophy. Periventricular white matter low attenuation likely
secondary to microangiopathy.

Vascular: Cerebrovascular atherosclerotic calcifications are noted.

Skull: Negative for fracture or focal lesion.

Sinuses/Orbits: Visualized portions of the orbits are unremarkable.
Mastoid sinuses are clear. Right maxillary sinus mucosal thickening.

Other: None.
IMPRESSION: No acute intracranial pathology.

## 2020-08-22 IMAGING — DX CHEST  1 VIEW
1 series · 1 of 1 positions shown · non-contrast
Comparison: Radiograph April 25, 2018.

CLINICAL DATA: Status post left thoracentesis.

EXAM:
CHEST  1 VIEW

[chest ap]
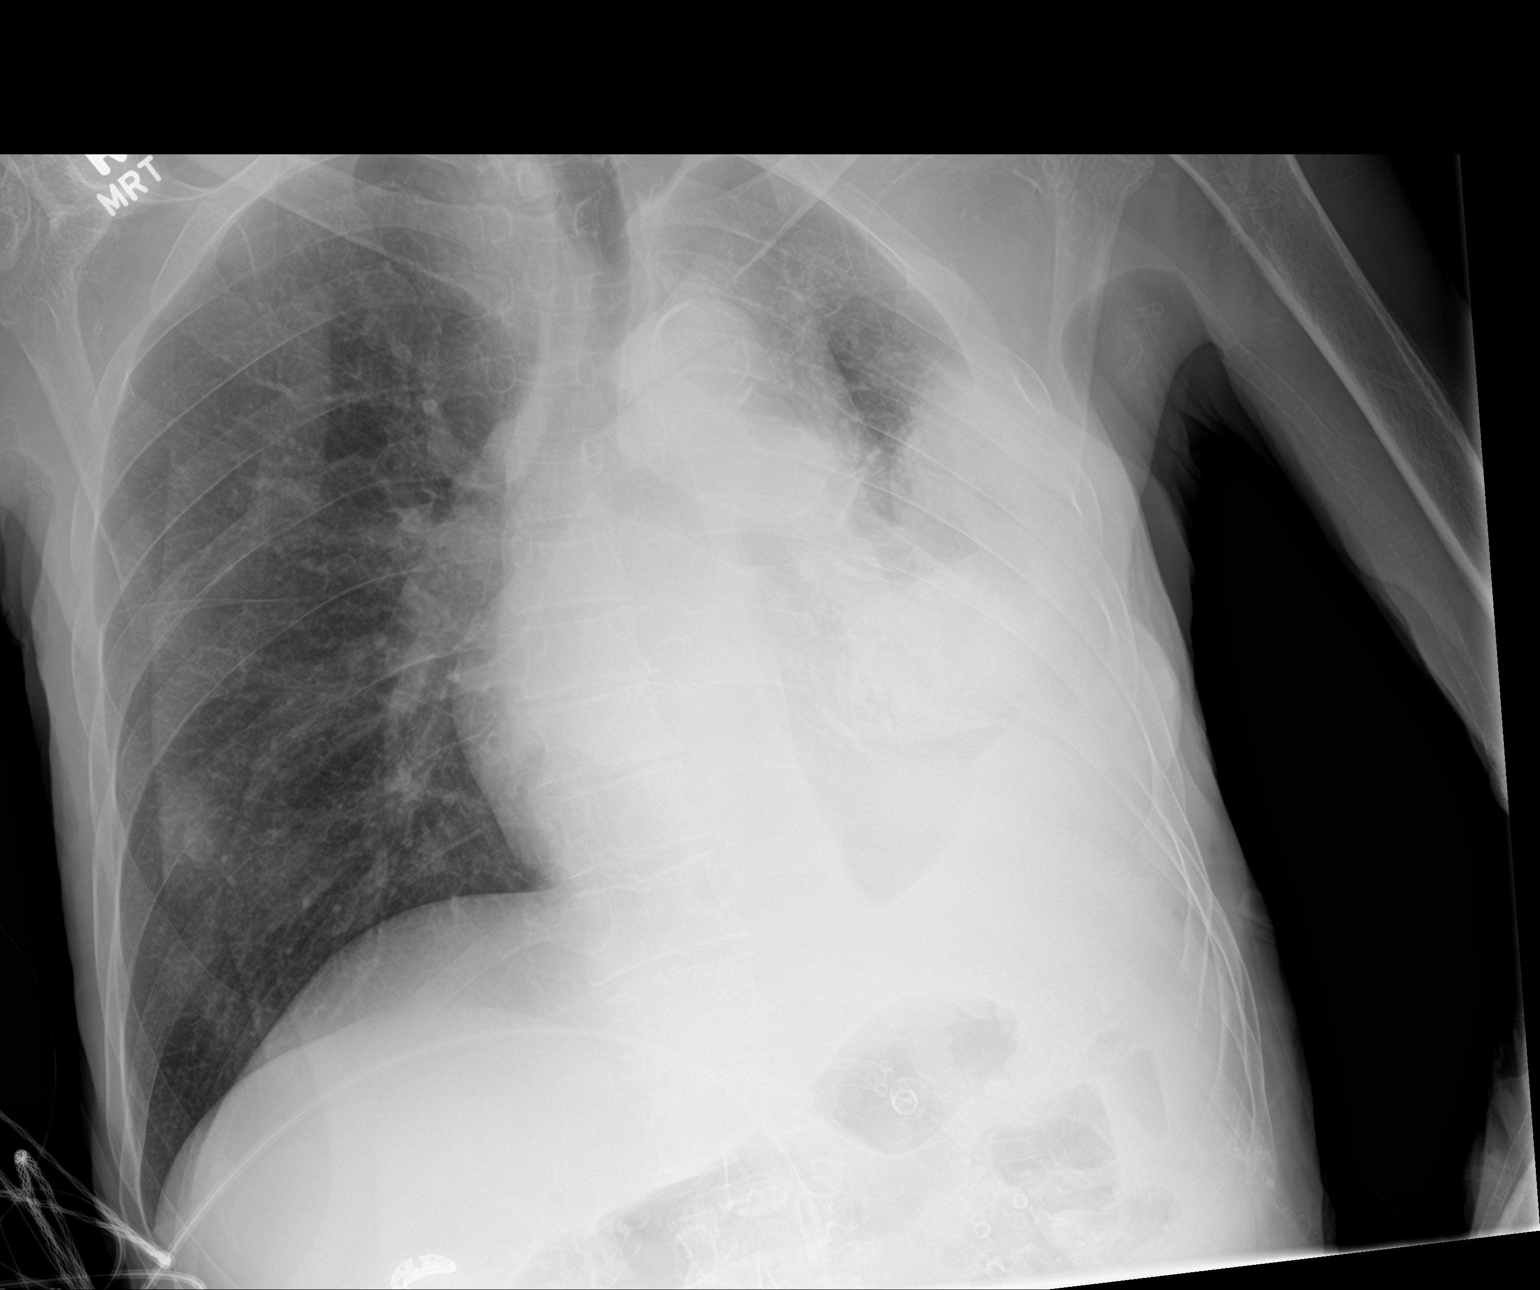

[1 of 1 positions shown; findings below may reference images not displayed]

FINDINGS: Stable cardiomegaly. Atherosclerosis of thoracic aorta is noted.
Grossly stable left pleural effusion is noted with associated
atelectasis or infiltrate. No pneumothorax is noted. Right lung is
hyperexpanded, but otherwise normal. Bony thorax unremarkable.
IMPRESSION: No pneumothorax status post left thoracentesis.

Aortic Atherosclerosis ([id number]).
